# Patient Record
Sex: Female | Born: 1980 | Race: Black or African American | Hispanic: No | Marital: Single | Smoking: Never smoker
Health system: Southern US, Community
[De-identification: ages and names within clinical notes are randomized; demographics above are authoritative.]

## PROBLEM LIST (undated history)

## (undated) DIAGNOSIS — R87619 Unspecified abnormal cytological findings in specimens from cervix uteri: Secondary | ICD-10-CM

## (undated) DIAGNOSIS — G43909 Migraine, unspecified, not intractable, without status migrainosus: Secondary | ICD-10-CM

## (undated) DIAGNOSIS — G44209 Tension-type headache, unspecified, not intractable: Secondary | ICD-10-CM

## (undated) HISTORY — PX: TUBAL LIGATION: SHX77

## (undated) HISTORY — PX: CRYOTHERAPY: SHX1416

## (undated) HISTORY — DX: Tension-type headache, unspecified, not intractable: G44.209

## (undated) HISTORY — DX: Unspecified abnormal cytological findings in specimens from cervix uteri: R87.619

## (undated) HISTORY — DX: Migraine, unspecified, not intractable, without status migrainosus: G43.909

---

## 2015-12-31 ENCOUNTER — Encounter: Payer: Self-pay | Admitting: Osteopathic Medicine

## 2015-12-31 ENCOUNTER — Ambulatory Visit (INDEPENDENT_AMBULATORY_CARE_PROVIDER_SITE_OTHER): Payer: BLUE CROSS/BLUE SHIELD | Admitting: Osteopathic Medicine

## 2015-12-31 VITALS — BP 130/63 | HR 84 | Temp 98.0°F | Ht 61.0 in | Wt 157.0 lb

## 2015-12-31 DIAGNOSIS — M9902 Segmental and somatic dysfunction of thoracic region: Secondary | ICD-10-CM | POA: Diagnosis not present

## 2015-12-31 DIAGNOSIS — N920 Excessive and frequent menstruation with regular cycle: Secondary | ICD-10-CM | POA: Diagnosis not present

## 2015-12-31 DIAGNOSIS — M9901 Segmental and somatic dysfunction of cervical region: Secondary | ICD-10-CM | POA: Diagnosis not present

## 2015-12-31 DIAGNOSIS — G44209 Tension-type headache, unspecified, not intractable: Secondary | ICD-10-CM | POA: Insufficient documentation

## 2015-12-31 LAB — POCT URINE PREGNANCY: PREG TEST UR: NEGATIVE

## 2015-12-31 MED ORDER — BUTALBITAL-APAP-CAFFEINE 50-325-40 MG PO TABS: 1.0000 | ORAL_TABLET | Freq: Four times a day (QID) | ORAL | Status: DC | PRN

## 2015-12-31 MED ORDER — ACETAMINOPHEN-CAFFEINE 500-65 MG PO CAPS: 1.0000 | ORAL_CAPSULE | Freq: Four times a day (QID) | ORAL | Status: DC | PRN

## 2015-12-31 MED ORDER — MEDROXYPROGESTERONE ACETATE 150 MG/ML IM SUSP
150.0000 mg | Freq: Once | INTRAMUSCULAR | Status: AC
  Administered 2015-12-31: 150 mg via INTRAMUSCULAR

## 2015-12-31 MED ORDER — MEDROXYPROGESTERONE ACETATE 150 MG/ML IM SUSP: 150.0000 mg | INTRAMUSCULAR | Status: DC

## 2015-12-31 NOTE — Patient Instructions (Addendum)
Tension Headache A tension headache is a feeling of pain, pressure, or aching that is often felt over the front and sides of the head. The pain can be dull, or it can feel tight (constricting). Tension headaches are not normally associated with nausea or vomiting, and they do not get worse with physical activity. Tension headaches can last from 30 minutes to several days. This is the most common type of headache. CAUSES The exact cause of this condition is not known. Tension headaches often begin after stress, anxiety, or depression. Other triggers may include:  Alcohol.  Too much caffeine, or caffeine withdrawal.  Respiratory infections, such as colds, flu, or sinus infections.  Dental problems or teeth clenching.  Fatigue.  Holding your head and neck in the same position for a long period of time, such as while using a computer.  Smoking. SYMPTOMS Symptoms of this condition include:  A feeling of pressure around the head.  Dull, aching head pain.  Pain felt over the front and sides of the head.  Tenderness in the muscles of the head, neck, and shoulders. DIAGNOSIS This condition may be diagnosed based on your symptoms and a physical exam. Tests may be done, such as a CT scan or an MRI of your head. These tests may be done if your symptoms are severe or unusual. TREATMENT This condition may be treated with lifestyle changes and medicines to help relieve symptoms. HOME CARE INSTRUCTIONS Managing Pain  Take over-the-counter and prescription medicines only as told by your health care provider.  Lie down in a dark, quiet room when you have a headache.  If directed, apply ice to the head and neck area:  Put ice in a plastic bag.  Place a towel between your skin and the bag.  Leave the ice on for 20 minutes, 2-3 times per day.  Use a heating pad or a hot shower to apply heat to the head and neck area as told by your health care provider. Eating and Drinking  Eat meals on  a regular schedule.  Limit alcohol use.  Decrease your caffeine intake, or stop using caffeine. General Instructions  Keep all follow-up visits as told by your health care provider. This is important.  Keep a headache journal to help find out what may trigger your headaches. For example, write down:  What you eat and drink.  How much sleep you get.  Any change to your diet or medicines.  Try massage or other relaxation techniques.  Limit stress.  Sit up straight, and avoid tensing your muscles.  Do not use tobacco products, including cigarettes, chewing tobacco, or e-cigarettes. If you need help quitting, ask your health care provider.  Exercise regularly as told by your health care provider.  Get 7-9 hours of sleep, or the amount recommended by your health care provider. SEEK MEDICAL CARE IF:  Your symptoms are not helped by medicine.  You have a headache that is different from what you normally experience.  You have nausea or you vomit.  You have a fever. SEEK IMMEDIATE MEDICAL CARE IF:  Your headache becomes severe.  You have repeated vomiting.  You have a stiff neck.  You have a loss of vision.  You have problems with speech.  You have pain in your eye or ear.  You have muscular weakness or loss of muscle control.  You lose your balance or you have trouble walking.  You feel faint or you pass out.  You have confusion.     This information is not intended to replace advice given to you by your health care provider. Make sure you discuss any questions you have with your health care provider.   Document Released: 12/12/2005 Document Revised: 09/02/2015 Document Reviewed: 04/06/2015 Elsevier Interactive Patient Education 2016 [employer]  

## 2015-12-31 NOTE — Progress Notes (Signed)
HPI: Judy Flores is a 35 y.o. female who presents to [facility] today for chief complaint of:  Chief Complaint  Patient presents with  . Establish Care  . Headache   HEADACHE . Location: temporal, occasionally tip of head (high cranium) . Quality: throbbing . Severity: moderate . Duration: every other day  . Timing: comes and goes . Context: hasn't been on Depo for about 5 - 6 months, (+) headaches assoc with periods . Modifying factors: has tried OTC Ibuprofen, OTC Tension headache with caffeine meds   . Assoc signs/symptoms: no - nausea/ phonophobia/ vision changes/ weakness/ speech changes. Some photophobia,  Consideration for imaging: NONE - NEGATIVE HISTORY OF FOLLOWING -  ?Focal neurologic signs or symptoms ?Onset of headache with exertion, cough, or sexual activity ?Onset of headache after age 58 years ?Recent significant change in the pattern, frequency, or severity of headaches ?Progressive worsening of headache despite appropriate therapy    Past medical, social and family history reviewed: No past medical history on file. No past surgical history on file. Social History  Substance Use Topics  . Smoking status: Not on file  . Smokeless tobacco: Not on file  . Alcohol Use: Not on file   No family history on file.  No current outpatient prescriptions on file.   No current facility-administered medications for this visit.   No Known Allergies    Review of Systems:  CONSTITUTIONAL:  No  fever, no chills, No  unintentional weight changes HEAD/EYES/EARS/NOSE/THROAT: Yes  headache, no vision change, no hearing change, No  sore throat, No  sinus pressure CARDIAC: No  chest pain, No  pressure, No palpitations, No  orthopnea RESPIRATORY: No  cough, No  shortness of breath/wheeze GASTROINTESTINAL: No  nausea, No  vomiting, No  abdominal pain, No  blood in stool, No  diarrhea, No  constipation  MUSCULOSKELETAL: No   myalgia/arthralgia GENITOURINARY: No  incontinence, No  abnormal genital bleeding/discharge SKIN: No  rash/wounds/concerning lesions HEM/ONC: No  easy bruising/bleeding, No  abnormal lymph node ENDOCRINE: No  polyuria/polydipsia/polyphagia, No  heat/cold intolerance  NEUROLOGIC: No  weakness, No  dizziness, No  slurred speech PSYCHIATRIC: No  concerns with depression, No  concerns with anxiety, No sleep problems     Exam:  BP 130/63 mmHg  Pulse 84  Temp(Src) 98 F (36.7 C) (Oral)  Ht 5\' 1"  (1.549 m)  Wt 157 lb (71.215 kg)  BMI 29.68 kg/m2  SpO2 100% Constitutional: VS see above. General Appearance: alert, well-developed, well-nourished, NAD Eyes: Normal lids and conjunctive, non-icteric sclera, PERRLA Ears, Nose, Mouth, Throat: MMM, Normal external inspection ears/nares/mouth/lips/gums, TM normal, posterior pharynx No  erythema No  exudate Neck: No masses, trachea midline. No thyroid enlargement/tenderness/mass appreciated. No lymphadenopathy Respiratory: Normal respiratory effort. no wheeze, no rhonchi, no rales Cardiovascular: S1/S2 normal, no murmur, no rub/gallop auscultated. RRR.  Musculoskeletal: Gait normal. No clubbing/cyanosis of digits. (+) muscle tension/asymmetry on L shoulder  Neurological: No cranial nerve deficit on limited exam. Motor and sensation intact and symmetric Skin: warm, dry, intact. No rash/ulcer. No concerning nevi or subq nodules on limited exam.   Psychiatric: Normal judgment/insight. Normal mood and affect. Oriented x3.    Procedure OMT: C-spine and uper T-spine, MF and ME applied to (+) pt relief  ASSESSMENT/PLAN:  Tension headache - Plan: butalbital-acetaminophen-caffeine (FIORICET) 50-325-40 MG tablet, DISCONTINUED: Acetaminophen-Caffeine 500-65 MG CAPS  Somatic dysfunction of cervical region  Somatic dysfunction of thoracic region  Menorrhagia with regular cycle - Plan: medroxyPROGESTERone (DEPO-PROVERA) 150  MG/ML injection, POCT urine  pregnancy, medroxyPROGESTERone (DEPO-PROVERA) injection 150 mg     Return in about 2 weeks (around 01/14/2016), or sooner if needed, for HEADACHE RECHECK WITH ANNUAL PHYSICAL.  /

## 2016-01-14 ENCOUNTER — Encounter: Payer: BLUE CROSS/BLUE SHIELD | Admitting: Osteopathic Medicine

## 2016-01-15 ENCOUNTER — Ambulatory Visit (INDEPENDENT_AMBULATORY_CARE_PROVIDER_SITE_OTHER): Payer: BLUE CROSS/BLUE SHIELD | Admitting: Osteopathic Medicine

## 2016-01-15 DIAGNOSIS — Z0000 Encounter for general adult medical examination without abnormal findings: Secondary | ICD-10-CM

## 2016-01-15 NOTE — Progress Notes (Signed)
NO SHOW

## 2016-01-25 ENCOUNTER — Other Ambulatory Visit (HOSPITAL_COMMUNITY)
Admission: RE | Admit: 2016-01-25 | Discharge: 2016-01-25 | Disposition: A | Discharge status: Home / Self Care | Payer: BLUE CROSS/BLUE SHIELD | Source: Ambulatory Visit | Attending: Osteopathic Medicine | Admitting: Osteopathic Medicine

## 2016-01-25 ENCOUNTER — Encounter: Payer: Self-pay | Admitting: Osteopathic Medicine

## 2016-01-25 ENCOUNTER — Ambulatory Visit (INDEPENDENT_AMBULATORY_CARE_PROVIDER_SITE_OTHER): Payer: BLUE CROSS/BLUE SHIELD | Admitting: Osteopathic Medicine

## 2016-01-25 VITALS — BP 120/68 | HR 72 | Ht 61.0 in | Wt 159.0 lb

## 2016-01-25 DIAGNOSIS — Z1151 Encounter for screening for human papillomavirus (HPV): Secondary | ICD-10-CM | POA: Insufficient documentation

## 2016-01-25 DIAGNOSIS — Z113 Encounter for screening for infections with a predominantly sexual mode of transmission: Secondary | ICD-10-CM | POA: Diagnosis present

## 2016-01-25 DIAGNOSIS — Z0000 Encounter for general adult medical examination without abnormal findings: Secondary | ICD-10-CM | POA: Diagnosis not present

## 2016-01-25 DIAGNOSIS — Z13220 Encounter for screening for lipoid disorders: Secondary | ICD-10-CM

## 2016-01-25 DIAGNOSIS — Z124 Encounter for screening for malignant neoplasm of cervix: Secondary | ICD-10-CM

## 2016-01-25 DIAGNOSIS — G44209 Tension-type headache, unspecified, not intractable: Secondary | ICD-10-CM | POA: Diagnosis not present

## 2016-01-25 DIAGNOSIS — Z01419 Encounter for gynecological examination (general) (routine) without abnormal findings: Secondary | ICD-10-CM | POA: Diagnosis present

## 2016-01-25 DIAGNOSIS — Z5181 Encounter for therapeutic drug level monitoring: Secondary | ICD-10-CM | POA: Diagnosis not present

## 2016-01-25 DIAGNOSIS — N760 Acute vaginitis: Secondary | ICD-10-CM | POA: Diagnosis present

## 2016-01-25 MED ORDER — BUTALBITAL-APAP-CAFFEINE 50-325-40 MG PO TABS: 1.0000 | ORAL_TABLET | Freq: Four times a day (QID) | ORAL | Status: AC | PRN

## 2016-01-25 MED ORDER — AMITRIPTYLINE HCL 25 MG PO TABS: 25.0000 mg | ORAL_TABLET | Freq: Every day | ORAL | Status: DC
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
## 2016-01-25 NOTE — Patient Instructions (Addendum)
Tension Headache A tension headache is a feeling of pain, pressure, or aching that is often felt over the front and sides of the head. The pain can be dull, or it can feel tight (constricting). Tension headaches are not normally associated with nausea or vomiting, and they do not get worse with physical activity. Tension headaches can last from 30 minutes to several days. This is the most common type of headache. CAUSES The exact cause of this condition is not known. Tension headaches often begin after stress, anxiety, or depression. Other triggers may include:  Alcohol.  Too much caffeine, or caffeine withdrawal.  Respiratory infections, such as colds, flu, or sinus infections.  Dental problems or teeth clenching.  Fatigue.  Holding your head and neck in the same position for a long period of time, such as while using a computer.  Smoking. SYMPTOMS Symptoms of this condition include:  A feeling of pressure around the head.  Dull, aching head pain.  Pain felt over the front and sides of the head.  Tenderness in the muscles of the head, neck, and shoulders. DIAGNOSIS This condition may be diagnosed based on your symptoms and a physical exam. Tests may be done, such as a CT scan or an MRI of your head. These tests may be done if your symptoms are severe or unusual. TREATMENT This condition may be treated with lifestyle changes and medicines to help relieve symptoms. HOME CARE INSTRUCTIONS Managing Pain  Take over-the-counter and prescription medicines only as told by your health care provider.  Lie down in a dark, quiet room when you have a headache.  If directed, apply ice to the head and neck area:  Put ice in a plastic bag.  Place a towel between your skin and the bag.  Leave the ice on for 20 minutes, 2-3 times per day.  Use a heating pad or a hot shower to apply heat to the head and neck area as told by your health care provider. Eating and Drinking  Eat meals on  a regular schedule.  Limit alcohol use.  Decrease your caffeine intake, or stop using caffeine. General Instructions  Keep all follow-up visits as told by your health care provider. This is important.  Keep a headache journal to help find out what may trigger your headaches. For example, write down:  What you eat and drink.  How much sleep you get.  Any change to your diet or medicines.  Try massage or other relaxation techniques.  Limit stress.  Sit up straight, and avoid tensing your muscles.  Do not use tobacco products, including cigarettes, chewing tobacco, or e-cigarettes. If you need help quitting, ask your health care provider.  Exercise regularly as told by your health care provider.  Get 7-9 hours of sleep, or the amount recommended by your health care provider. SEEK MEDICAL CARE IF:  Your symptoms are not helped by medicine.  You have a headache that is different from what you normally experience.  You have nausea or you vomit.  You have a fever. SEEK IMMEDIATE MEDICAL CARE IF:  Your headache becomes severe.  You have repeated vomiting.  You have a stiff neck.  You have a loss of vision.  You have problems with speech.  You have pain in your eye or ear.  You have muscular weakness or loss of muscle control.  You lose your balance or you have trouble walking.  You feel faint or you pass out.  You have confusion.     This information is not intended to replace advice given to you by your health care provider. Make sure you discuss any questions you have with your health care provider.   Document Released: 12/12/2005 Document Revised: 09/02/2015 Document Reviewed: 04/06/2015 Elsevier Interactive Patient Education 2016 [employer]  

## 2016-01-27 LAB — CYTOLOGY - PAP

## 2016-01-28 LAB — CBC WITH DIFFERENTIAL/PLATELET
BASOS ABS: 0.1 10*3/uL (ref 0.0–0.1)
BASOS PCT: 1 % (ref 0–1)
EOS ABS: 0.1 10*3/uL (ref 0.0–0.7)
Eosinophils Relative: 1 % (ref 0–5)
HCT: 35.2 % — ABNORMAL LOW (ref 36.0–46.0)
HEMOGLOBIN: 11 g/dL — AB (ref 12.0–15.0)
Lymphocytes Relative: 28 % (ref 12–46)
Lymphs Abs: 1.8 10*3/uL (ref 0.7–4.0)
MCH: 26.8 pg (ref 26.0–34.0)
MCHC: 31.3 g/dL (ref 30.0–36.0)
MCV: 85.9 fL (ref 78.0–100.0)
MPV: 9.1 fL (ref 8.6–12.4)
Monocytes Absolute: 0.3 10*3/uL (ref 0.1–1.0)
Monocytes Relative: 5 % (ref 3–12)
NEUTROS ABS: 4.2 10*3/uL (ref 1.7–7.7)
NEUTROS PCT: 65 % (ref 43–77)
PLATELETS: 378 10*3/uL (ref 150–400)
RBC: 4.1 MIL/uL (ref 3.87–5.11)
RDW: 13.7 % (ref 11.5–15.5)
WBC: 6.5 10*3/uL (ref 4.0–10.5)

## 2016-01-29 LAB — LIPID PANEL
CHOLESTEROL: 159 mg/dL (ref 125–200)
HDL: 56 mg/dL (ref >=46)
LDL CALC: 95 mg/dL (ref <130)
Total CHOL/HDL Ratio: 2.8 Ratio (ref <=5.0)
Triglycerides: 41 mg/dL (ref <150)
VLDL: 8 mg/dL (ref <30)

## 2016-01-29 LAB — COMPLETE METABOLIC PANEL WITH GFR
ALT: 10 U/L (ref 6–29)
AST: 14 U/L (ref 10–30)
Albumin: 4.4 g/dL (ref 3.6–5.1)
Alkaline Phosphatase: 54 U/L (ref 33–115)
BILIRUBIN TOTAL: 0.4 mg/dL (ref 0.2–1.2)
BUN: 10 mg/dL (ref 7–25)
CALCIUM: 9 mg/dL (ref 8.6–10.2)
CHLORIDE: 105 mmol/L (ref 98–110)
CO2: 27 mmol/L (ref 20–31)
CREATININE: 0.57 mg/dL (ref 0.50–1.10)
GFR, Est Non African American: >89 mL/min (ref >=60)
Glucose, Bld: 81 mg/dL (ref 65–99)
Potassium: 4.1 mmol/L (ref 3.5–5.3)
Sodium: 140 mmol/L (ref 135–146)
TOTAL PROTEIN: 7.3 g/dL (ref 6.1–8.1)

## 2016-07-06 ENCOUNTER — Ambulatory Visit (INDEPENDENT_AMBULATORY_CARE_PROVIDER_SITE_OTHER): Payer: BLUE CROSS/BLUE SHIELD | Admitting: Osteopathic Medicine

## 2016-07-06 ENCOUNTER — Encounter: Payer: Self-pay | Admitting: Osteopathic Medicine

## 2016-07-06 VITALS — BP 110/76 | HR 85 | Ht 61.0 in | Wt 169.0 lb

## 2016-07-06 DIAGNOSIS — N760 Acute vaginitis: Secondary | ICD-10-CM | POA: Diagnosis not present

## 2016-07-06 DIAGNOSIS — G44209 Tension-type headache, unspecified, not intractable: Secondary | ICD-10-CM

## 2016-07-06 DIAGNOSIS — Z3042 Encounter for surveillance of injectable contraceptive: Secondary | ICD-10-CM

## 2016-07-06 DIAGNOSIS — A499 Bacterial infection, unspecified: Secondary | ICD-10-CM

## 2016-07-06 DIAGNOSIS — B9689 Other specified bacterial agents as the cause of diseases classified elsewhere: Secondary | ICD-10-CM

## 2016-07-06 DIAGNOSIS — R21 Rash and other nonspecific skin eruption: Secondary | ICD-10-CM | POA: Diagnosis not present

## 2016-07-06 LAB — POCT URINE PREGNANCY: PREG TEST UR: NEGATIVE

## 2016-07-06 MED ORDER — MEDROXYPROGESTERONE ACETATE 150 MG/ML IM SUSP
150.0000 mg | Freq: Once | INTRAMUSCULAR | Status: AC
  Administered 2016-07-06: 150 mg via INTRAMUSCULAR

## 2016-07-06 MED ORDER — METRONIDAZOLE 500 MG PO TABS: 500.0000 mg | ORAL_TABLET | Freq: Two times a day (BID) | ORAL | Status: DC

## 2016-07-06 NOTE — Progress Notes (Signed)
HPI: Judy Flores is a 35 y.o. Not Hispanic or Latino female  who presents to [facility] today, 07/06/2016,  for chief complaint of:  Chief Complaint  Patient presents with  . Vaginal Discharge  . Contraception    DEPO INJECTION     . VAGINAL DISCHARGE: went swimming (pool) and seems to get bacterial vaginosis from this. Not too severe but bothersome. Has had BV in the past - feels similar. No new sexual contacts. No fever/chills, no joint pain.  Marland Kitchen RASH -  Bilateral ankles, itches, patchy, no meds tried, no insect bites she knows of. Also appeared right after was in the pool for awhile.   CONTRACEPTION - last records form here, Depo 12/2015 >3 mos ago  Hx HEADACHES - doing well on meds as below   Past medical, surgical, social and family history reviewed: Past Medical History  Diagnosis Date  . Tension headache    Past Surgical History  Procedure Laterality Date  . Tubal ligation     Social History  Substance Use Topics  . Smoking status: Never Smoker   . Smokeless tobacco: Not on file  . Alcohol Use: No   Family History  Problem Relation Age of Onset  . Hypertension Maternal Grandmother      Current medication list and allergy/intolerance information reviewed:   Current Outpatient Prescriptions  Medication Sig Dispense Refill  . amitriptyline (ELAVIL) 25 MG tablet Take 1 tablet (25 mg total) by mouth at bedtime. Can increase to 2 tablets (50 mg total) by mouth at bedtime after 1 week. 60 tablet 2  . medroxyPROGESTERone (DEPO-PROVERA) 150 MG/ML injection Inject 1 mL (150 mg total) into the muscle every 3 (three) months. 1 mL 3  . Multiple Vitamins-Minerals (WOMENS MULTI PO) Take by mouth.     No current facility-administered medications for this visit.   No Known Allergies    Review of Systems:  Constitutional:  No  fever, no chills, No recent illness,  HEENT: No  headache, no vision change  Cardiac: No  chest  pain  Musculoskeletal: No new myalgia/arthralgia  Genitourinary: No  abnormal genital bleeding, (+) abnormal genital discharge  Skin: (+) Rash, No other wounds/concerning lesions   Exam:  BP 110/76 mmHg  Pulse 85  Ht  (1.549 m)  Wt 169 lb (76.658 kg)  BMI 31.95 kg/m2  Constitutional: VS see above. General Appearance: alert, well-developed, well-nourished, NAD  Skin: Mild erythema, scaly patches on ankles bilaterally c/w possible tinea, less likely allergic urticaria, Skin otherwise warm, dry, intact. No rash/ulcer. No concerning nevi or subq nodules on limited exam.    Psychiatric: Normal judgment/insight. Normal mood and affect. Oriented x3.     ASSESSMENT/PLAN:   Confirm neg pregnancy, Depo given, f/u q3 mos consistently  Rash likely tinea - OTC clotrimazole adviesd, RTC if no better  Discharge likely BV - Flagyl Rx, side effects reviewed, swab deferred at this time, no need for STD testing but will get wet prep and GC/CT screen if discharge persists.   Bacterial vaginosis - Plan: metroNIDAZOLE (FLAGYL) 500 MG tablet  Rash and nonspecific skin eruption  On Depo-Provera for contraception  Tension headache     Visit summary with medication list and pertinent instructions was printed for patient to review. All questions at time of visit were answered - patient instructed to contact office with any additional concerns. ER/RTC precautions were reviewed with the patient. Follow-up plan: Return in about 3 months (around 10/06/2016), or sooner if  needed or if symptoms worsen or fail to get better, for NURSE VISIT - DEPO INJECTION. Otherwise, due for annual wellness exam 12/2016.

## 2016-07-06 NOTE — Addendum Note (Signed)
Addended by: Pixie CasinoUNNINGHAM, Khalin Royce C on: 07/06/2016 10:29 AM   Modules accepted: Orders

## 2016-07-06 NOTE — Patient Instructions (Addendum)
Clotrimazole cream for rash - can get this OTC. Continue for one week after rash resolves. If not better, let me know.

## 2016-08-08 ENCOUNTER — Encounter: Payer: Self-pay | Admitting: Osteopathic Medicine

## 2016-08-08 ENCOUNTER — Ambulatory Visit (INDEPENDENT_AMBULATORY_CARE_PROVIDER_SITE_OTHER): Payer: BLUE CROSS/BLUE SHIELD | Admitting: Osteopathic Medicine

## 2016-08-08 VITALS — BP 115/79 | HR 81 | Ht 61.0 in | Wt 171.0 lb

## 2016-08-08 DIAGNOSIS — N970 Female infertility associated with anovulation: Secondary | ICD-10-CM | POA: Diagnosis not present

## 2016-08-08 DIAGNOSIS — Z3042 Encounter for surveillance of injectable contraceptive: Secondary | ICD-10-CM | POA: Diagnosis not present

## 2016-08-08 LAB — CBC WITH DIFFERENTIAL/PLATELET
BASOS ABS: 0 {cells}/uL (ref 0–200)
Basophils Relative: 0 %
EOS ABS: 144 {cells}/uL (ref 15–500)
Eosinophils Relative: 2 %
HEMATOCRIT: 35.6 % (ref 35.0–45.0)
Hemoglobin: 11.2 g/dL — ABNORMAL LOW (ref 11.7–15.5)
LYMPHS ABS: 1584 {cells}/uL (ref 850–3900)
MCH: 26.9 pg — AB (ref 27.0–33.0)
MCHC: 31.5 g/dL — ABNORMAL LOW (ref 32.0–36.0)
MCV: 85.4 fL (ref 80.0–100.0)
MONO ABS: 360 {cells}/uL (ref 200–950)
MPV: 9.4 fL (ref 7.5–12.5)
Monocytes Relative: 5 %
NEUTROS PCT: 71 %
Neutro Abs: 5112 cells/uL (ref 1500–7800)
Platelets: 367 10*3/uL (ref 140–400)
RBC: 4.17 MIL/uL (ref 3.80–5.10)
RDW: 14.1 % (ref 11.0–15.0)
WBC: 7.2 10*3/uL (ref 3.8–10.8)

## 2016-08-08 MED ORDER — NORETHIN ACE-ETH ESTRAD-FE 1.5-30 MG-MCG PO TABS: 1.0000 | ORAL_TABLET | Freq: Every day | ORAL | 0 refills | Status: DC

## 2016-08-08 MED ORDER — IBUPROFEN 800 MG PO TABS
800.0000 mg | ORAL_TABLET | Freq: Three times a day (TID) | ORAL | 0 refills | Status: AC
Start: 2016-08-08 — End: ?

## 2016-08-08 NOTE — Patient Instructions (Signed)
Unscheduled bleeding with Depo-Provera is a common side effect with long-term use of this medicine. My suspicion is that the bleeding you are experiencing, even though it has gotten a bit heavy, is due to long-term effects of Depo-Provera, rather then to any other concerning cause.   The first step in management of bleeding on Depo is 5-7 days of nonsteroidal anti-inflammatory medication (Ibuprofen 800 mg three times per day), plus 1 month of birth control pills with estrogen component to them. As long as no significant anemia requiring intervention, and no worsening of pain or serious bleeding, bleeding should get better with this medication regimen.   If your symptoms are worsening or failing to improve - for instance, if you are still having spotting after about a week or 2 on the birth control medications, or if your pain worsens or your continue to have significant clot passage -  we will need to consider an ultrasound at that point as well as possible referral to OB/GYN since you may need a surgical procedure such as D&C to help stop the bleeding.   It is possible that you will have future events of irregular bleeding on Depo, I could not reliably predict if or when this could happen, if at all. If you would like to consider alternative contraception methods, let us know.    Plan: 1. Lab downstairs to check blood counts for anemia 2. Go to pharmacy for   prescription dose NSAID    estrogen containing birth control pills 3. Take medications as directed, bleeding should improve 4. If bleeding is not better in the next 1-2 weeks, or if it gets worse or he have worsening pain, let us know!

## 2016-08-08 NOTE — Progress Notes (Signed)
HPI: Judy Flores is a [age] y.o. female  who presents to [facility] [place] today, 08/08/16,  for chief complaint of:  Chief Complaint  Patient presents with  . Vaginal Bleeding    . Context: Increased bleeding over 3 weeks, patient on Depo-Provera. Was seen in emergency room early this morning. Pregnancy test negative. No concerns on urinalysis - small blood on catheterized specimen. No CBC performed.   . Quality: heavier past few days, passing some clots, other times just dark brownish bloody visit.  . Duration: 3 weeks altogether . Modifying factors: on Depo as noted above   Past medical, surgical, social and family history reviewed: Past Medical History:  Diagnosis Date  . Tension headache    Past Surgical History:  Procedure Laterality Date  . TUBAL LIGATION     Social History  Substance Use Topics  . Smoking status: Never Smoker  . Smokeless tobacco: Not on file  . Alcohol use No   Family History  Problem Relation Age of Onset  . Hypertension Maternal Grandmother      Current medication list and allergy/intolerance information reviewed:   Current Outpatient Prescriptions  Medication Sig Dispense Refill  . amitriptyline (ELAVIL) 25 MG tablet Take 1 tablet (25 mg total) by mouth at bedtime. Can increase to 2 tablets (50 mg total) by mouth at bedtime after 1 week. 60 tablet 2  . Multiple Vitamins-Minerals (WOMENS MULTI PO) Take by mouth.     No current facility-administered medications for this visit.    No Known Allergies    Review of Systems:  Constitutional:  No  fever, no chills, No recent illness, No unintentional weight changes. (+) significant fatigue.   HEENT: No  headache, no vision change  Cardiac: No  chest pain, No  pressure, No palpitation  Respiratory:  No  shortness of breath  Gastrointestinal: (+) crampy lower  abdominal pain, No  nausea, No  vomiting,  No  blood in stool, No  diarrhea, No  constipation    Musculoskeletal: No new myalgia/arthralgia  Genitourinary: No  incontinence, (+) abnormal genital bleeding, No abnormal genital discharge  Skin: No  Rash, No other wounds/concerning lesions  Hem/Onc: No  easy bruising/bleeding from gums, no bloody stool  Exam:  BP 115/79   Pulse 81   Ht 5\' 1"  (1.549 m)   Wt 171 lb (77.6 kg)   BMI 32.31 kg/m   Constitutional: VS see above. General Appearance: alert, well-developed, well-nourished, NAD  Eyes: Normal lids and conjunctive, non-icteric sclera  Ears, Nose, Mouth, Throat: MMM, Normal external inspection ears/nares/mouth/lips/gums.  Neck: No masses, trachea midline.   Respiratory: Normal respiratory effort.   Cardiovascular: No lower extremity edema.   Gastrointestinal: Nontender, no masses.  Neurological: Normal balance/coordination. No tremor.   Skin: warm, dry, intact. No rash/ulcer.  Gyn: No lesions/ulcers to external genitalia, normal urethra, normal vaginal mucosa, (+) dark bloody discharge, cervix normal without lesions and no blood oozing from cervical os, uterus not enlarged or tender, adnexa no masses and nontender    ASSESSMENT/PLAN:   Anovulatory (dysfunctional uterine) bleeding - Plan: CBC with Differential, ibuprofen (ADVIL,MOTRIN) 800 MG tablet, norethindrone-ethinyl estradiol-iron (MICROGESTIN FE,GILDESS FE,LOESTRIN FE) 1.5-30 MG-MCG tablet  On Depo-Provera for contraception - Plan: norethindrone-ethinyl estradiol-iron (MICROGESTIN FE,GILDESS FE,LOESTRIN FE) 1.5-30 MG-MCG tablet   Patient Instructions  Unscheduled bleeding with Depo-Provera is a common side effect with long-term use of this medicine. My suspicion is that the bleeding you are experiencing, even though it has gotten a bit heavy,  is due to long-term effects of Depo-Provera, rather then to any other concerning cause.   The first step in management of bleeding on Depo is 5-7 days of nonsteroidal anti-inflammatory medication (Ibuprofen 800 mg three  times per day), plus 1 month of birth control pills with estrogen component to them. As long as no significant anemia requiring intervention, and no worsening of pain or serious bleeding, bleeding should get better with this medication regimen.   If your symptoms are worsening or failing to improve - for instance, if you are still having spotting after about a week or 2 on the birth control medications, or if your pain worsens or your continue to have significant clot passage -  we will need to consider an ultrasound at that point as well as possible referral to OB/GYN since you may need a surgical procedure such as D&C to help stop the bleeding.   It is possible that you will have future events of irregular bleeding on Depo, I could not reliably predict if or when this could happen, if at all. If you would like to consider alternative contraception methods, let us know.    Plan: 1. Lab downstairs to check blood counts for anemia 2. Go to pharmacy for   prescription dose NSAID    estrogen containing birth control pills 3. Take medications as directed, bleeding should improve 4. If bleeding is not better in the next 1-2 weeks, or if it gets worse or he have worsening pain, let us know!    Visit summary with medication list and pertinent instructions was printed for patient to review. All questions at time of visit were answered - patient instructed to contact office with any additional concerns. ER/RTC precautions were reviewed with the patient. Follow-up plan: Return if symptoms worsen or fail to improve.

## 2016-08-11 ENCOUNTER — Inpatient Hospital Stay: Payer: BLUE CROSS/BLUE SHIELD | Admitting: Osteopathic Medicine

## 2016-09-05 ENCOUNTER — Encounter: Payer: Self-pay | Admitting: Osteopathic Medicine

## 2016-09-05 DIAGNOSIS — N939 Abnormal uterine and vaginal bleeding, unspecified: Secondary | ICD-10-CM

## 2016-09-07 ENCOUNTER — Telehealth: Payer: Self-pay

## 2016-09-07 NOTE — Telephone Encounter (Signed)
Myriam JacobsonHelen called  York SpanielSaid that she need a order added for ultrasound pelvic complete it is needed in addition to the US transvaginal that has already been ordered. Please advise patient is scheduled for ultrasound for 09/08/2016. Rhonda Cunningham,CMA

## 2016-09-08 ENCOUNTER — Ambulatory Visit (INDEPENDENT_AMBULATORY_CARE_PROVIDER_SITE_OTHER): Payer: BLUE CROSS/BLUE SHIELD

## 2016-09-08 ENCOUNTER — Other Ambulatory Visit: Payer: Self-pay | Admitting: Osteopathic Medicine

## 2016-09-08 DIAGNOSIS — N939 Abnormal uterine and vaginal bleeding, unspecified: Secondary | ICD-10-CM | POA: Diagnosis not present

## 2016-09-12 ENCOUNTER — Other Ambulatory Visit: Payer: Self-pay | Admitting: Osteopathic Medicine

## 2016-09-12 DIAGNOSIS — N970 Female infertility associated with anovulation: Secondary | ICD-10-CM

## 2016-09-12 DIAGNOSIS — Z3042 Encounter for surveillance of injectable contraceptive: Secondary | ICD-10-CM

## 2016-09-13 ENCOUNTER — Telehealth: Payer: Self-pay

## 2016-09-13 ENCOUNTER — Encounter: Payer: Self-pay | Admitting: Osteopathic Medicine

## 2016-09-13 NOTE — Telephone Encounter (Signed)
Left message for pt to call office. Received Referral from primary care.

## 2016-09-14 ENCOUNTER — Other Ambulatory Visit: Payer: Self-pay | Admitting: Osteopathic Medicine

## 2016-09-14 DIAGNOSIS — N970 Female infertility associated with anovulation: Secondary | ICD-10-CM

## 2016-09-14 DIAGNOSIS — Z3042 Encounter for surveillance of injectable contraceptive: Secondary | ICD-10-CM

## 2016-09-14 MED ORDER — NORETHIN ACE-ETH ESTRAD-FE 1.5-30 MG-MCG PO TABS: 1.0000 | ORAL_TABLET | Freq: Every day | ORAL | 0 refills | Status: DC

## 2016-09-20 ENCOUNTER — Encounter: Payer: BLUE CROSS/BLUE SHIELD | Admitting: Obstetrics & Gynecology

## 2016-10-05 ENCOUNTER — Other Ambulatory Visit: Payer: Self-pay | Admitting: Osteopathic Medicine

## 2016-10-05 DIAGNOSIS — Z3042 Encounter for surveillance of injectable contraceptive: Secondary | ICD-10-CM

## 2016-10-05 DIAGNOSIS — N970 Female infertility associated with anovulation: Secondary | ICD-10-CM

## 2016-10-06 NOTE — Telephone Encounter (Signed)
She was prescribed the birth control pills due to abnormal bleeding, this is not something that should be refilled. I referred her to OB/GYN, I see where they have called the patient on 09/13/2016, can confirm with the patient if she is still having bleeding problems she'll follow-up with OB/GYN

## 2016-10-06 NOTE — Telephone Encounter (Signed)
Pt states she missed appt with OB/GYN but has rescheduled for 10/11/16. Will route.

## 2016-10-06 NOTE — Telephone Encounter (Signed)
Judy Flores continue to take the BCP and not get the Depo Provera? She is one day past due for the Depo Provera injection. Please advise.

## 2016-10-06 NOTE — Telephone Encounter (Signed)
Left message advising a call back.

## 2016-10-11 ENCOUNTER — Ambulatory Visit (INDEPENDENT_AMBULATORY_CARE_PROVIDER_SITE_OTHER): Payer: BLUE CROSS/BLUE SHIELD | Admitting: Obstetrics & Gynecology

## 2016-10-11 ENCOUNTER — Encounter: Payer: Self-pay | Admitting: Obstetrics & Gynecology

## 2016-10-11 VITALS — BP 112/73 | HR 89 | Resp 16 | Ht 62.0 in | Wt 173.0 lb

## 2016-10-11 DIAGNOSIS — N921 Excessive and frequent menstruation with irregular cycle: Secondary | ICD-10-CM

## 2016-10-11 MED ORDER — NORETHIN ACE-ETH ESTRAD-FE 1-20 MG-MCG(24) PO TABS: 1.0000 | ORAL_TABLET | Freq: Every day | ORAL | 11 refills | Status: DC

## 2016-10-11 NOTE — Patient Instructions (Signed)
Caltrate 2 by mouth daily

## 2016-10-11 NOTE — Progress Notes (Signed)
History:  35 y.o. [id number] here today for f/u of AUB.  She has been on Depo Provera for 7 years.  After her last injection she began to have bleeding that would not stop.  Her last injection was July 06, 2016. Pt stopped bleeding while on Loestrin 1.5/30 on 2 different occassions.  Pt had BTL in 02/2009.  She thought she was told tha tshe still needed birth control.  The following portions of the patient's history were reviewed and updated as appropriate: allergies, current medications, past family history, past medical history, past social history, past surgical history and problem list.  Review of Systems:  Pertinent items are noted in HPI.  Objective:  Physical Exam Blood pressure 112/73, pulse 89, resp. rate 16, height 5\' 2"  (1.575 m), weight 173 lb (78.5 kg). Gen: NAD Lungs: CTA CV: RRR Abd: Soft, nontender and nondistended Pelvic: Normal appearing external genitalia; normal appearing vaginal mucosa and cervix.  Normal discharge.  Small uterus, no other palpable masses, no uterine or adnexal tenderness  Labs and Imaging 09/08/2016 CLINICAL DATA:  C/o vaginal bleeding intermittently for the past 3 months. PT states being on Depo-Provera shot x 6653yrs. Hx Tubal ligation.  EXAM: TRANSABDOMINAL AND TRANSVAGINAL ULTRASOUND OF PELVIS  TECHNIQUE: Both transabdominal and transvaginal ultrasound examinations of the pelvis were performed. Transabdominal technique was performed for global imaging of the pelvis including uterus, ovaries, adnexal regions, and pelvic cul-de-sac. It was necessary to proceed with endovaginal exam following the transabdominal exam to visualize the endometrium and ovaries.  COMPARISON:  None  FINDINGS: Uterus  Measurements: 9.2 x 3.8 x 4.7 cm. Somewhat heterogeneous echotexture, but no discrete mass. Cervix is unremarkable.  Endometrium  Thickness: 7.6 mm.  No focal abnormality visualized.  Right ovary  Measurements: 2.8 x 2.1 x 1.8 cm. Normal  appearance/no adnexal mass.  Left ovary  Measurements: 2.5 x 1.1 x 1.6 cm. Normal appearance/no adnexal mass.  Other findings  No abnormal free fluid.  IMPRESSION: 1. Uterus has a somewhat heterogeneous echotexture but is otherwise unremarkable. Endometrium is normal in thickness. No endometrial mass or endometrial canal fluid. 2. Normal ovaries and adnexa   [facility] both fallopian tubes.  Assessment & Plan:  Abnormal bleeding on Depo provera.   Pt will not get another Depo provera at present Loestrin 1/20 1 po q day F/u in 4 months or sooner prn Caltrate 2 po q day  Total face-to-face time with patient was 20 min.  Greater than 50% was spent in counseling and coordination of care with the patient. We discussed pathology seen from prev BTL (chart reviewed on Care Everywhere); probable etiology of AUB; bone protection after prolonged Depo Provera use.  Nalaysia Manganiello L. Harraway-Smith, M.D., [facility]       Shakeerah Gradel L. Harraway-Smith, M.D., [facility]

## 2016-11-28 ENCOUNTER — Encounter: Payer: Self-pay | Admitting: Osteopathic Medicine

## 2016-11-29 ENCOUNTER — Other Ambulatory Visit: Payer: Self-pay | Admitting: Osteopathic Medicine

## 2016-11-29 DIAGNOSIS — N760 Acute vaginitis: Principal | ICD-10-CM

## 2016-11-29 DIAGNOSIS — B9689 Other specified bacterial agents as the cause of diseases classified elsewhere: Secondary | ICD-10-CM

## 2016-11-29 MED ORDER — METRONIDAZOLE 500 MG PO TABS: 500.0000 mg | ORAL_TABLET | Freq: Two times a day (BID) | ORAL | 0 refills | Status: AC

## 2017-03-06 ENCOUNTER — Encounter: Payer: Self-pay | Admitting: Osteopathic Medicine

## 2017-03-06 MED ORDER — METRONIDAZOLE 500 MG PO TABS: 500.0000 mg | ORAL_TABLET | Freq: Two times a day (BID) | ORAL | 0 refills | Status: AC

## 2017-03-09 ENCOUNTER — Ambulatory Visit (INDEPENDENT_AMBULATORY_CARE_PROVIDER_SITE_OTHER): Payer: Self-pay | Admitting: Osteopathic Medicine

## 2017-03-09 ENCOUNTER — Encounter: Payer: Self-pay | Admitting: Osteopathic Medicine

## 2017-03-09 VITALS — BP 121/69 | HR 72 | Ht 62.0 in | Wt 172.0 lb

## 2017-03-09 DIAGNOSIS — B9689 Other specified bacterial agents as the cause of diseases classified elsewhere: Secondary | ICD-10-CM

## 2017-03-09 DIAGNOSIS — N760 Acute vaginitis: Secondary | ICD-10-CM

## 2017-03-09 DIAGNOSIS — Z113 Encounter for screening for infections with a predominantly sexual mode of transmission: Secondary | ICD-10-CM

## 2017-03-09 DIAGNOSIS — N898 Other specified noninflammatory disorders of vagina: Secondary | ICD-10-CM

## 2017-03-09 DIAGNOSIS — Z30013 Encounter for initial prescription of injectable contraceptive: Secondary | ICD-10-CM | POA: Insufficient documentation

## 2017-03-09 DIAGNOSIS — N939 Abnormal uterine and vaginal bleeding, unspecified: Secondary | ICD-10-CM

## 2017-03-09 LAB — WET PREP, GENITAL
Trich, Wet Prep: NONE SEEN
Yeast Wet Prep HPF POC: NONE SEEN

## 2017-03-09 LAB — POCT URINE PREGNANCY: Preg Test, Ur: NEGATIVE

## 2017-03-09 MED ORDER — CALCIUM CARBONATE-VITAMIN D 600-400 MG-UNIT PO CHEW: 2.0000 | CHEWABLE_TABLET | Freq: Every day | ORAL | 3 refills | Status: AC

## 2017-03-09 MED ORDER — MEDROXYPROGESTERONE ACETATE 150 MG/ML IM SUSP
150.0000 mg | Freq: Once | INTRAMUSCULAR | Status: AC
  Administered 2017-03-09: 150 mg via INTRAMUSCULAR

## 2017-03-09 MED ORDER — METRONIDAZOLE 0.75 % VA GEL: 1.0000 | Freq: Every day | VAGINAL | 1 refills | Status: DC

## 2017-03-09 NOTE — Progress Notes (Signed)
HPI: Judy Flores is a 36 y.o. female  who presents to [facility] [place] today, 03/09/17,  for chief complaint of:  Chief Complaint  Patient presents with  . Contraception  . SEXUALLY TRANSMITTED DISEASE    SCREENING     See chart for review of patient email encounters. Since last visit, has undergone 2 treatments with metronidazole for presumed bacterial vaginosis and was encouraged to follow-up if symptoms persisted.   Dancing intermittent thin vaginal discharge. Sounds consistent with bacterial vaginosis but patient is concerned as keeps recurring. She is not particularly worried about sexual transmitted infections but would like to go ahead and get checked for gonorrhea, chlamydia, Trichomonas.   Status post BTL 02/2009.  Recent records reviewed from OB/GYN visit 10/11/2016. Abnormal bleeding on Depo-Provera, was discontinued at that point. Started Loestrin 1/20 daily, plus Caltrate supplementation after prolonged Depo-Provera use.  Patient is experiencing intermittent bleeding and spotting, would like to get back on the Depo-Provera to help stop periods   Past medical history, surgical history, social history and family history reviewed.  Patient Active Problem List   Diagnosis Date Noted  . Encounter for prescription for depo-Provera 03/09/2017  . Tension headache 12/31/2015    Current medication list and allergy/intolerance information reviewed.   Current Outpatient Prescriptions on File Prior to Visit  Medication Sig Dispense Refill  . amitriptyline (ELAVIL) 25 MG tablet Take 1 tablet (25 mg total) by mouth at bedtime. Can increase to 2 tablets (50 mg total) by mouth at bedtime after 1 week. 60 tablet 2  . diclofenac (VOLTAREN) 75 MG EC tablet Take by mouth.    Marland Kitchen. ibuprofen (ADVIL,MOTRIN) 800 MG tablet Take 1 tablet (800 mg total) by mouth 3 (three) times daily. For 5 - 7 days (can discontinue use if bleeding stops) 30 tablet 0  .  medroxyPROGESTERone (DEPO-PROVERA) 150 MG/ML injection Inject into the muscle.    . metroNIDAZOLE (FLAGYL) 500 MG tablet Take 1 tablet (500 mg total) by mouth 2 (two) times daily. For bacterial vaginosis 14 tablet 0  . Multiple Vitamins-Minerals (THERA-M) TABS Take by mouth.    . Multiple Vitamins-Minerals (WOMENS MULTI PO) Take by mouth.    . Norethindrone Acetate-Ethinyl Estrad-FE (LOESTRIN 24 FE) 1-20 MG-MCG(24) tablet Take 1 tablet by mouth daily. 1 Package 11   No current facility-administered medications on file prior to visit.    No Known Allergies    Review of Systems:  Constitutional: No recent illness  Cardiac: No  chest pain,  Respiratory:  No  shortness of breath  GU: Vaginal discharge and bleeding as noted in history of present illness  Neurologic: No  weakness, No  Dizziness   Exam:  BP 121/69   Pulse 72   Ht 5\' 2"  (1.575 m)   Wt 172 lb (78 kg)   BMI 31.46 kg/m   Constitutional: VS see above. General Appearance: alert, well-developed, well-nourished, NAD  Eyes: Normal lids and conjunctive, non-icteric sclera  Ears, Nose, Mouth, Throat: MMM, Normal external inspection ears/nares/mouth/lips/gums.  Neck: No masses, trachea midline.   Respiratory: Normal respiratory effort. no wheeze, no rhonchi, no rales  Cardiovascular: S1/S2 normal, no murmur, no rub/gallop auscultated. RRR.   Musculoskeletal: Gait normal. Symmetric and independent movement of all extremities  Neurological: Normal balance/coordination. No tremor.  Skin: warm, dry, intact.   Psychiatric: Normal judgment/insight. Normal mood and affect. Oriented x3.  GYN: No lesions/ulcers to external genitalia, normal urethra, normal vaginal mucosa, physiologic discharge - swab collected for wet prep,  cervix normal without lesions, uterus not enlarged or tender, adnexa no masses and nontender    Recent Results (from the past 2160 hour(s))  POCT urine pregnancy     Status: None   Collection Time:  03/09/17 10:54 AM  Result Value Ref Range   Preg Test, Ur Negative Negative  Wet prep, genital     Status: Abnormal   Collection Time: 03/09/17 11:06 AM  Result Value Ref Range   Yeast Wet Prep HPF POC NONE SEEN NONE SEEN   Trich, Wet Prep NONE SEEN NONE SEEN   Clue Cells Wet Prep HPF POC MOD (A) NONE SEEN   WBC, Wet Prep HPF POC FEW NONE SEEN     ASSESSMENT/PLAN:   Bacterial vaginosis - Will trial MetroGel. Condier Boric Acid.   Encounter for prescription for depo-Provera - Plan: POCT urine pregnancy, medroxyPROGESTERone (DEPO-PROVERA) injection 150 mg  Screen for STD (sexually transmitted disease) - Patient declines blood test at this time for HIV, RPR, hepatitis B. Advised to reconsider, we'll definitely want to test if any other STI is positive - Plan: GC/Chlamydia Probe Amp, RPR, HIV antibody, Hepatitis B core antibody, total, Hepatitis B surface antibody, Hepatitis B surface antigen, Wet prep, genital, CANCELED: HIV antibody  Vaginal discharge - Plan: CANCELED: Wet prep, genital  Abnormal uterine bleeding - Okay to get back on With calcium/vitamin D supplementation. Encouraged patient to talk with GYN regarding procedures such as ablation    Patient Instructions  Plan:  STI screening today: urine for gonorrhea & chlamydia, blood for HIV, Syphilis, Hepatitis B  Vaginal swab to evaluate for BV vs yeast, will also check for STI trichomonas  Depending on results, will consider another treatment for BV  Restart Depo - if bleeding persists, I would make appointment with GYN to discuss procedures which may be long-term/permanent solutions to persistent bleeding. Long-term Depo can cause problems with calcium deficiency, see prescription for supplementation w/ Calcium + Vitamin D (also available OTC)     Follow-up plan: Return for annual physical when due, sooner if needed.  Visit summary with medication list and pertinent instructions was printed for patient to review, alert  [name] if any changes needed. All questions at time of visit were answered - patient instructed to contact office with any additional concerns. ER/RTC precautions were reviewed with the patient and understanding verbalized.

## 2017-03-09 NOTE — Patient Instructions (Addendum)
Plan:  STI screening today: urine for gonorrhea & chlamydia, blood for HIV, Syphilis, Hepatitis B  Vaginal swab to evaluate for BV vs yeast, will also check for STI trichomonas  Depending on results, will consider another treatment for BV  Restart Depo - if bleeding persists, I would make appointment with GYN to discuss procedures which may be long-term/permanent solutions to persistent bleeding. Long-term Depo can cause problems with calcium deficiency, see prescription for supplementation w/ Calcium + Vitamin D (also available OTC)

## 2017-03-10 ENCOUNTER — Telehealth: Payer: Self-pay

## 2017-03-10 LAB — GC/CHLAMYDIA PROBE AMP
CT Probe RNA: NOT DETECTED
GC Probe RNA: NOT DETECTED

## 2017-03-10 NOTE — Telephone Encounter (Signed)
Pt called back given lab results. 

## 2017-03-15 ENCOUNTER — Encounter: Payer: Self-pay | Admitting: Osteopathic Medicine

## 2017-03-16 ENCOUNTER — Other Ambulatory Visit: Payer: Self-pay | Admitting: Osteopathic Medicine

## 2017-03-16 MED ORDER — CLINDAMYCIN HCL 300 MG PO CAPS: 300.0000 mg | ORAL_CAPSULE | Freq: Two times a day (BID) | ORAL | 0 refills | Status: DC

## 2017-03-16 NOTE — Progress Notes (Signed)
Sending clinda for BV

## 2017-04-10 ENCOUNTER — Other Ambulatory Visit: Payer: Self-pay | Admitting: Osteopathic Medicine

## 2017-04-11 ENCOUNTER — Ambulatory Visit (INDEPENDENT_AMBULATORY_CARE_PROVIDER_SITE_OTHER): Payer: Managed Care, Other (non HMO) | Admitting: Osteopathic Medicine

## 2017-04-11 ENCOUNTER — Encounter: Payer: Self-pay | Admitting: Osteopathic Medicine

## 2017-04-11 VITALS — BP 118/82 | HR 98 | Ht 61.0 in | Wt 170.0 lb

## 2017-04-11 DIAGNOSIS — G44209 Tension-type headache, unspecified, not intractable: Secondary | ICD-10-CM

## 2017-04-11 NOTE — Progress Notes (Signed)
HPI: Judy Flores is a 36 y.o. female  who presents to [facility] today, 04/11/17,  for chief complaint of:  Chief Complaint  Patient presents with  . Other    FMLA PAPERWORK    Received FMLA paperwork in my inbox but, patient and I had not discussed filling this out. She is here today to discuss forms and have these completed. History of tension headache which may cause missed days. 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 Encounter for prescription for depo-Provera 03/09/2017  . Abnormal uterine bleeding 03/09/2017  . Bacterial vaginosis 03/09/2017  . Screen for STD (sexually transmitted disease) 03/09/2017  . Tension headache 12/31/2015   Past Surgical History:  Procedure Laterality Date  . CRYOTHERAPY    . TUBAL LIGATION     Social History  Substance Use Topics  . Smoking status: Never Smoker  . Smokeless tobacco: Never Used  . Alcohol use No   Family History  Problem Relation Age of Onset  . Hypertension Maternal Grandmother   . Diabetes Maternal Grandmother   . Breast cancer Paternal Grandmother      Current medication list and allergy/intolerance information reviewed:   Current Outpatient Prescriptions  Medication Sig Dispense Refill  . amitriptyline (ELAVIL) 25 MG tablet Take 1 tablet (25 mg total) by mouth at bedtime. Can increase to 2 tablets (50 mg total) by mouth at bedtime after 1 week. 60 tablet 2  . Calcium Carbonate-Vitamin D 600-400 MG-UNIT chew tablet Chew 2 tablets by mouth daily. 180 tablet 3  . clindamycin (CLEOCIN) 300 MG capsule Take 1 capsule (300 mg total) by mouth 2 (two) times daily. For one week for BV 14 capsule 0  . diclofenac (VOLTAREN) 75 MG EC tablet Take by mouth.    Marland Kitchen ibuprofen (ADVIL,MOTRIN) 800 MG tablet Take 1 tablet  (800 mg total) by mouth 3 (three) times daily. For 5 - 7 days (can discontinue use if bleeding stops) 30 tablet 0  . medroxyPROGESTERone (DEPO-PROVERA) 150 MG/ML injection Inject into the muscle.    . metroNIDAZOLE (METROGEL) 0.75 % vaginal gel Place 1 Applicatorful vaginally at bedtime. For 5 days 70 g 1  . Multiple Vitamins-Minerals (THERA-M) TABS Take by mouth.    . Multiple Vitamins-Minerals (WOMENS MULTI PO) Take by mouth.    . Norethindrone Acetate-Ethinyl Estrad-FE (LOESTRIN 24 FE) 1-20 MG-MCG(24) tablet Take 1 tablet by mouth daily. 1 Package 11   No current facility-administered medications for this visit.    No Known Allergies    Review of Systems:  Constitutional:  No recent illness, feels well today.  HEENT: No headache  Exam:  BP 118/82   Pulse 98   Ht  (1.549 m)   Wt 170 lb (77.1 kg)   BMI 32.12 kg/m   Constitutional: VS see above. General Appearance: alert, well-developed, well-nourished, NAD    ASSESSMENT/PLAN:   Tension headache - FMLA forms completed     Visit summary with medication list and pertinent instructions was printed for patient to review. All questions at time of visit were answered - patient instructed to contact office with any additional concerns. ER/RTC precautions were reviewed with the patient. Follow-up plan: Return for routine care / as needed .  Note: Total time spent 5 minutes, greater than 50% of the visit  was spent face-to-face counseling and coordinating care for the following: The encounter diagnosis was Tension headache.Marland Kitchen

## 2017-05-12 ENCOUNTER — Encounter: Payer: Self-pay | Admitting: Osteopathic Medicine

## 2017-05-15 ENCOUNTER — Encounter: Payer: Self-pay | Admitting: Osteopathic Medicine

## 2017-05-15 NOTE — Telephone Encounter (Signed)
No problem, happy to take her!

## 2017-05-17 ENCOUNTER — Ambulatory Visit (INDEPENDENT_AMBULATORY_CARE_PROVIDER_SITE_OTHER): Payer: Managed Care, Other (non HMO) | Admitting: Sports Medicine

## 2017-05-17 ENCOUNTER — Encounter: Payer: Self-pay | Admitting: Sports Medicine

## 2017-05-17 DIAGNOSIS — M67432 Ganglion, left wrist: Secondary | ICD-10-CM

## 2017-05-17 DIAGNOSIS — M67431 Ganglion, right wrist: Secondary | ICD-10-CM | POA: Diagnosis not present

## 2017-05-17 NOTE — Progress Notes (Signed)
Subjective:    I'm seeing this patient as a consultation for:  Dr. Sunnie NielsenNatalie Flores  CC: Ganglion cysts  HPI: This is a pleasant 36 year old female, for several months now she's had enlarging cysts on both wrists, left worse than right, minimally tender. Symptoms are mild, persistent. She does desire interventional treatment today. NSAIDs and activity modification have not been effective.  Past medical history:  Negative.  See flowsheet/record as well for more information.  Surgical history: Negative.  See flowsheet/record as well for more information.  Family history: Negative.  See flowsheet/record as well for more information.  Social history: Negative.  See flowsheet/record as well for more information.  Allergies, and medications have been entered into the medical record, reviewed, and no changes needed.   Review of Systems: No headache, visual changes, nausea, vomiting, diarrhea, constipation, dizziness, abdominal pain, skin rash, fevers, chills, night sweats, weight loss, swollen lymph nodes, body aches, joint swelling, muscle aches, chest pain, shortness of breath, mood changes, visual or auditory hallucinations.   Objective:   General: Well Developed, well nourished, and in no acute distress.  Neuro/Psych: Alert and oriented x3, extra-ocular muscles intact, able to move all 4 extremities, sensation grossly intact. Skin: Warm and dry, no rashes noted.  Respiratory: Not using accessory muscles, speaking in full sentences, trachea midline.  Cardiovascular: Pulses palpable, no extremity edema. Abdomen: Does not appear distended. Bilateral wrists: On the left wrist there is a ganglion cyst in the anatomical snuffbox, on the right wrist it is in the dorsal radiocarpal joint ROM smooth and normal with good flexion and extension and ulnar/radial deviation that is symmetrical with opposite wrist. Palpation is normal over metacarpals, navicular, lunate, and TFCC; tendons without  tenderness/ swelling No snuffbox tenderness. No tenderness over Canal of Guyon. Strength 5/5 in all directions without pain. Negative tinel's and phalens signs. Negative Finkelstein sign. Negative Watson's test.  Procedure: Real-time Ultrasound Guided Injection of right dorsal radial ganglion cyst Device: GE Logiq E  Verbal informed consent obtained.  Time-out conducted.  Noted no overlying erythema, induration, or other signs of local infection.  Skin prepped in a sterile fashion.  Local anesthesia: Topical Ethyl chloride.  With sterile technique and under real time ultrasound guidance:  I fenestrated the cyst with a 25-gauge needle and then injected 1/2 mL kenalog 40, 1/2 mL lidocaine Completed without difficulty  Pain immediately resolved suggesting accurate placement of the medication.  Advised to call if fevers/chills, erythema, induration, drainage, or persistent bleeding.  Images permanently stored and available for review in the ultrasound unit.  Impression: Technically successful ultrasound guided injection.  Procedure: Real-time Ultrasound Guided aspiration/injection of left lateral wrist ganglion cyst Device: GE Logiq E  Verbal informed consent obtained.  Time-out conducted.  Noted no overlying erythema, induration, or other signs of local infection.  Skin prepped in a sterile fashion.  Local anesthesia: Topical Ethyl chloride.  With sterile technique and under real time ultrasound guidance:  Aspirated scant amount of thick fluid with an 18-gauge needle and then injected 1/2 mL kenalog 40, 1/2 mL lidocaine Completed without difficulty  Pain immediately resolved suggesting accurate placement of the medication.  Advised to call if fevers/chills, erythema, induration, drainage, or persistent bleeding.  Images permanently stored and available for review in the ultrasound unit.  Impression: Technically successful ultrasound guided injection.  Impression and Recommendations:    This case required medical decision making of moderate complexity.  Ganglion cyst of both wrists Injection and fenestration on the right side. Aspiration and  injection on the left side. Return in one month.

## 2017-05-17 NOTE — Assessment & Plan Note (Signed)
Injection and fenestration on the right side. Aspiration and injection on the left side. Return in one month.

## 2017-06-16 ENCOUNTER — Ambulatory Visit: Payer: Managed Care, Other (non HMO) | Admitting: Sports Medicine

## 2017-06-16 DIAGNOSIS — Z0189 Encounter for other specified special examinations: Secondary | ICD-10-CM

## 2017-06-20 ENCOUNTER — Ambulatory Visit (INDEPENDENT_AMBULATORY_CARE_PROVIDER_SITE_OTHER): Payer: Managed Care, Other (non HMO) | Admitting: Sports Medicine

## 2017-06-20 DIAGNOSIS — M67431 Ganglion, right wrist: Secondary | ICD-10-CM | POA: Diagnosis not present

## 2017-06-20 DIAGNOSIS — M67432 Ganglion, left wrist: Secondary | ICD-10-CM | POA: Diagnosis not present

## 2017-06-20 NOTE — Assessment & Plan Note (Signed)
After injection fenestration on the right and aspiration and injection on the left patient returns completely symptom free with no recurrence of the ganglion cysts.

## 2017-06-20 NOTE — Progress Notes (Signed)
  Subjective:    CC: Follow-up  HPI: This is a pleasant 36 year old female, last month we treated ganglion cysts on both wrists and she returns today symptom and pain-free. Happy with results.  Past medical history:  Negative.  See flowsheet/record as well for more information.  Surgical history: Negative.  See flowsheet/record as well for more information.  Family history: Negative.  See flowsheet/record as well for more information.  Social history: Negative.  See flowsheet/record as well for more information.  Allergies, and medications have been entered into the medical record, reviewed, and no changes needed.   Review of Systems: No fevers, chills, night sweats, weight loss, chest pain, or shortness of breath.   Objective:    General: Well Developed, well nourished, and in no acute distress.  Neuro: Alert and oriented x3, extra-ocular muscles intact, sensation grossly intact.  HEENT: Normocephalic, atraumatic, pupils equal round reactive to light, neck supple, no masses, no lymphadenopathy, thyroid nonpalpable.  Skin: Warm and dry, no rashes. Cardiac: Regular rate and rhythm, no murmurs rubs or gallops, no lower extremity edema.  Respiratory: Clear to auscultation bilaterally. Not using accessory muscles, speaking in full sentences. Bilateral wrists: Inspection normal with no visible erythema or swelling. ROM smooth and normal with good flexion and extension and ulnar/radial deviation that is symmetrical with opposite wrist. Palpation is normal over metacarpals, navicular, lunate, and TFCC; tendons without tenderness/ swelling No snuffbox tenderness. No tenderness over Canal of Guyon. Strength 5/5 in all directions without pain. Negative tinel's and phalens signs. Negative Finkelstein sign. Negative Watson's test.  Impression and Recommendations:    Ganglion cyst of both wrists After injection fenestration on the right and aspiration and injection on the left patient returns  completely symptom free with no recurrence of the ganglion cysts.

## 2017-06-21 ENCOUNTER — Ambulatory Visit (INDEPENDENT_AMBULATORY_CARE_PROVIDER_SITE_OTHER): Payer: Managed Care, Other (non HMO) | Admitting: Osteopathic Medicine

## 2017-06-21 VITALS — BP 118/76 | HR 74

## 2017-06-21 DIAGNOSIS — Z3042 Encounter for surveillance of injectable contraceptive: Secondary | ICD-10-CM

## 2017-06-21 LAB — POCT URINE PREGNANCY: Preg Test, Ur: NEGATIVE

## 2017-06-21 MED ORDER — MEDROXYPROGESTERONE ACETATE 150 MG/ML IM SUSP
150.0000 mg | Freq: Once | INTRAMUSCULAR | Status: AC
  Administered 2017-06-21: 150 mg via INTRAMUSCULAR

## 2017-06-21 NOTE — Progress Notes (Signed)
Patient takes Depo-Provera more for abnormal bleeding and desire to not have periods, she has bilateral tubal ligation, pregnancy test negative 1 should be sufficient

## 2017-06-21 NOTE — Progress Notes (Signed)
Pt came into clinic today for depo-provera injection. Pt is not within the window for administration. Per PCP, since Pt has had her tubes tied she can take one pregnancy test then get injection. Pregnancy test was negative. Pt tolerated injection in right deltoid (per request) well, no immediate complications. Advised to return in September for next injection, printed calendar provided. No further questions.

## 2017-09-06 ENCOUNTER — Ambulatory Visit (INDEPENDENT_AMBULATORY_CARE_PROVIDER_SITE_OTHER): Payer: Managed Care, Other (non HMO) | Admitting: Osteopathic Medicine

## 2017-09-06 VITALS — BP 116/75 | HR 94

## 2017-09-06 DIAGNOSIS — Z309 Encounter for contraceptive management, unspecified: Secondary | ICD-10-CM | POA: Diagnosis not present

## 2017-09-06 MED ORDER — MEDROXYPROGESTERONE ACETATE 150 MG/ML IM SUSP
150.0000 mg | Freq: Once | INTRAMUSCULAR | Status: AC
  Administered 2017-09-06: 150 mg via INTRAMUSCULAR

## 2017-09-06 NOTE — Progress Notes (Signed)
Pt here for depo provera injection.  Tolerated injection well and without problems.  Denies any problems or concerns between injections.  Chart with next injection dates given to patient.

## 2017-09-26 ENCOUNTER — Encounter: Payer: Self-pay | Admitting: Osteopathic Medicine

## 2017-11-23 ENCOUNTER — Ambulatory Visit (INDEPENDENT_AMBULATORY_CARE_PROVIDER_SITE_OTHER): Payer: Managed Care, Other (non HMO) | Admitting: Osteopathic Medicine

## 2017-11-23 VITALS — BP 129/75 | HR 90

## 2017-11-23 DIAGNOSIS — Z30013 Encounter for initial prescription of injectable contraceptive: Secondary | ICD-10-CM | POA: Diagnosis not present

## 2017-11-23 MED ORDER — MEDROXYPROGESTERONE ACETATE 150 MG/ML IM SUSP
150.0000 mg | Freq: Once | INTRAMUSCULAR | Status: AC
  Administered 2017-11-23: 150 mg via INTRAMUSCULAR

## 2017-11-23 NOTE — Progress Notes (Signed)
Pt came into clinic today for depo-provera injection. Reports no negative side effects from the medication. Pt tolerated injection in ROUQ well, no immediate complications. Advised to return in February for next injection, printed calendar provided. No further questions.

## 2018-01-11 ENCOUNTER — Ambulatory Visit: Payer: Self-pay

## 2018-01-12 ENCOUNTER — Encounter: Payer: Self-pay | Admitting: Osteopathic Medicine

## 2018-01-12 ENCOUNTER — Ambulatory Visit (INDEPENDENT_AMBULATORY_CARE_PROVIDER_SITE_OTHER): Payer: Managed Care, Other (non HMO) | Admitting: Osteopathic Medicine

## 2018-01-12 VITALS — BP 118/61 | HR 75 | Temp 98.1°F | Ht 61.0 in | Wt 180.1 lb

## 2018-01-12 DIAGNOSIS — G44209 Tension-type headache, unspecified, not intractable: Secondary | ICD-10-CM

## 2018-01-12 MED ORDER — TOPIRAMATE ER 50 MG PO CAP24: 50.0000 mg | ORAL_CAPSULE | Freq: Every day | ORAL | 5 refills | Status: DC

## 2018-01-12 NOTE — Patient Instructions (Signed)
Trial Topiramate/Trokendi for headache prevention - let me know how you're doing on this medicine after 2-4 weeks (sooner if any problems)!

## 2018-01-12 NOTE — Progress Notes (Signed)
HPI: Judy Flores is a 37 y.o. female who  has a past medical history of Abnormal Pap smear of cervix and Tension headache.  [facility] today, 01/12/18,  for chief complaint of: FMLA forms for tension headache.  FMLA forms filled out.   Still having tension headaches few times per week. She is using Elavil but only taking as-needed. 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     Past medical, surgical, social and family history reviewed  Patient Active Problem List   Diagnosis Date Noted  . Ganglion cyst of both wrists 05/17/2017  . Encounter for prescription for depo-Provera 03/09/2017  . Abnormal uterine bleeding 03/09/2017  . Bacterial vaginosis 03/09/2017  . Screen for STD (sexually transmitted disease) 03/09/2017  . Tension headache 12/31/2015    Past Surgical History:  Procedure Laterality Date  . CRYOTHERAPY    . TUBAL LIGATION      Current medication list and allergy/intolerance information reviewed.   Review of Systems:  Constitutional:  No  fever, no chills, No recent illness  HEENT: +headache, no vision change, no hearing change, No sore throat, No  sinus pressure  Cardiac: No  chest pain, No  pressure, No palpitations  Respiratory:  No  shortness of breath.   Musculoskeletal: No new myalgia/arthralgia  Neurologic: No  weakness, No  dizziness   Exam:  BP 118/61   Pulse 75   Temp 98.1 F (36.7 C) (Oral)   Ht 5\' 1"  (1.549 m)   Wt 180 lb 1.3 oz (81.7 kg)   BMI 34.03 kg/m   Constitutional: VS see above. General Appearance: alert, well-developed, well-nourished, NAD  Eyes: Normal lids and conjunctive, non-icteric sclera  Ears, Nose, Mouth, Throat: MMM, Normal external inspection ears/nares/mouth/lips/gums.  Neck: No masses, trachea midline  Respiratory: Normal respiratory effort. no wheeze, no rhonchi, no rales  Cardiovascular: S1/S2 normal,  no murmur, no rub/gallop auscultated. RRR. No lower extremity edema.   Musculoskeletal: Gait normal.  Neurological: Normal balance/coordination. No tremor.   Skin: warm, dry, intact.     Psychiatric: Normal judgment/insight. Normal mood and affect. Oriented x3.    No results found for this or any previous visit (from the past 72 hour(s)).  No results found.   ASSESSMENT/PLAN: The encounter diagnosis was Tension headache.   Some miscommunication regarding use of amitriptyline. She is taking it as needed for headaches in the evenings, but waking up with grogginess/drunken feeling so she tries not to take it. Given that this medication is meant to be at daily use for prevention, we should try something else.  Will trial long-acting topiramate on daily basis for headaches. If tolerating this well but not controlling headache recurrence sufficiently, can increase the dose.  Meds ordered this encounter  Medications  . Topiramate ER (TROKENDI XR) 50 MG CP24    Sig: Take 50 mg by mouth daily.    Dispense:  30 capsule    Refill:  5     Patient Instructions  Trial Topiramate/Trokendi for headache prevention - let me know how you're doing on this medicine after 2-4 weeks (sooner if any problems)!     Visit summary with medication list and pertinent instructions was printed for patient to review. All questions at time of visit were answered - patient instructed to contact office with any additional concerns. ER/RTC precautions were reviewed with the patient.   Follow-up plan: Return if headache symptoms worsen or  fail to improve.  Note: Total time spent 25 minutes, greater than 50% of the visit was spent face-to-face counseling and coordinating care for the following: The encounter diagnosis was Tension headache..  Please note: voice recognition software was used to produce this document, and typos may escape review. Please contact Dr. Lyn Hollingshead for any needed clarifications.

## 2018-02-08 ENCOUNTER — Ambulatory Visit: Payer: Managed Care, Other (non HMO)

## 2018-02-13 ENCOUNTER — Ambulatory Visit: Payer: Managed Care, Other (non HMO)

## 2018-02-22 ENCOUNTER — Ambulatory Visit (INDEPENDENT_AMBULATORY_CARE_PROVIDER_SITE_OTHER): Payer: Managed Care, Other (non HMO) | Admitting: Osteopathic Medicine

## 2018-02-22 VITALS — BP 116/79 | HR 96 | Temp 98.7°F | Resp 16 | Wt 181.0 lb

## 2018-02-22 DIAGNOSIS — F418 Other specified anxiety disorders: Secondary | ICD-10-CM | POA: Diagnosis not present

## 2018-02-22 DIAGNOSIS — Z8742 Personal history of other diseases of the female genital tract: Secondary | ICD-10-CM

## 2018-02-22 DIAGNOSIS — N939 Abnormal uterine and vaginal bleeding, unspecified: Secondary | ICD-10-CM

## 2018-02-22 MED ORDER — ALPRAZOLAM 0.5 MG PO TABS: 0.5000 mg | ORAL_TABLET | Freq: Two times a day (BID) | ORAL | 0 refills | Status: DC | PRN

## 2018-02-22 MED ORDER — MEDROXYPROGESTERONE ACETATE 150 MG/ML IM SUSP
150.0000 mg | Freq: Once | INTRAMUSCULAR | Status: AC
  Administered 2018-02-22: 150 mg via INTRAMUSCULAR

## 2018-02-22 NOTE — Progress Notes (Signed)
HPI: Judy Flores is a 37 y.o. female who  has a past medical history of Abnormal Pap smear of cervix and Tension headache.  she presents to [facility] today, 02/22/18,  for chief complaint of: Depo shot Depression/Anxiety   Patient initially just on the schedule for Depo shot, receptionist noticed that she seemed a bit on edge, pastor PHQ 9 which she scored fairly positively on so patient was worked into the schedule.  Patient states her daughter has recently been incarcerated. Judy Flores is understandably under a lot of stress due to this and what is making it more difficult for her is her employer is not being very accommodating with letting her out of work to attend her daughter's court dates. She has already missed one and she is really distressed about this.  She is concerned about taking medications, does not want to "get addicted" and doesn't want to be on any daily medications. No history of underlying anxiety/depression issues   Past medical, surgical, social and family history reviewed:  Patient Active Problem List   Diagnosis Date Noted  . Ganglion cyst of both wrists 05/17/2017  . Encounter for prescription for depo-Provera 03/09/2017  . Abnormal uterine bleeding 03/09/2017  . Bacterial vaginosis 03/09/2017  . Screen for STD (sexually transmitted disease) 03/09/2017  . Tension headache 12/31/2015    Past Surgical History:  Procedure Laterality Date  . CRYOTHERAPY    . TUBAL LIGATION      Social History   Tobacco Use  . Smoking status: Never Smoker  . Smokeless tobacco: Never Used  Substance Use Topics  . Alcohol use: No    Alcohol/week: 0.0 oz    Family History  Problem Relation Age of Onset  . Hypertension Maternal Grandmother   . Diabetes Maternal Grandmother   . Breast cancer Paternal Grandmother      Current medication list and allergy/intolerance information reviewed:    Current Outpatient Medications   Medication Sig Dispense Refill  . Calcium Carbonate-Vitamin D 600-400 MG-UNIT chew tablet Chew 2 tablets by mouth daily. 180 tablet 3  . ibuprofen (ADVIL,MOTRIN) 800 MG tablet Take 1 tablet (800 mg total) by mouth 3 (three) times daily. For 5 - 7 days (can discontinue use if bleeding stops) 30 tablet 0  . medroxyPROGESTERone (DEPO-PROVERA) 150 MG/ML injection Inject into the muscle.    . metroNIDAZOLE (METROGEL) 0.75 % vaginal gel Place 1 Applicatorful vaginally at bedtime. For 5 days 70 g 1  . Multiple Vitamins-Minerals (THERA-M) TABS Take by mouth.    . Multiple Vitamins-Minerals (WOMENS MULTI PO) Take by mouth.    . Topiramate ER (TROKENDI XR) 50 MG CP24 Take 50 mg by mouth daily. 30 capsule 5   No current facility-administered medications for this visit.     No Known Allergies    Review of Systems:  Constitutional:  No  fever, no chills, No recent illness  Cardiac: No  chest pain  Respiratory:  No  shortness of breath. No  Cough  Gastrointestinal: No  abdominal pain, No  nausea  Neurologic: No  weakness, No  dizziness,  Psychiatric: No  concerns with depression, +concerns with anxiety, +sleep problems, No mood problems  Exam:  BP 116/79 (BP Location: Left Arm, Patient Position: Sitting, Cuff Size: Large)   Pulse 96   Temp 98.7 F (37.1 C)   Resp 16   Wt 181 lb (82.1 kg)   SpO2 100%   BMI 34.20 kg/m   Constitutional: VS see above. General  Appearance: alert, well-developed, well-nourished, NAD  Eyes: Normal lids and conjunctive, non-icteric sclera  Ears, Nose, Mouth, Throat: MMM, Normal external inspection ears/nares/mouth/lips/gums.   Neck: No masses, trachea midline.  Respiratory: Normal respiratory effort. no wheeze, no rhonchi, no rales  Cardiovascular: S1/S2 normal, no murmur, no rub/gallop auscultated. RRR.   Musculoskeletal: Gait normal.   Neurological: Normal balance/coordination. No tremor.   Skin: warm, dry, intact. No rash/ulcer. Marland Kitchen.     Psychiatric: Normal judgment/insight. Anxious mood and affect. Oriented x3.       ASSESSMENT/PLAN: Discussed during use of benzodiazepine anxiolytic for situational anxiety. Behavioral health referral for counseling will likely be of greatest benefit, patient was written out of work as well for a few days to help stabilize her emotional/mental health state. If there is anything else she needs for me in order to get time off of work to help her daughter, I am happy to help! Patient states she feels better after talking with me, I don't believe she is at any risk of hurting self or others, just going through a really difficult time with her family.  Situational anxiety - Plan: ALPRAZolam (XANAX) 0.5 MG tablet, Ambulatory referral to Behavioral Health  History of heavy periods - Plan: medroxyPROGESTERone (DEPO-PROVERA) injection 150 mg     Visit summary with medication list and pertinent instructions was printed for patient to review. All questions at time of visit were answered - patient instructed to contact office with any additional concerns. ER/RTC precautions were reviewed with the patient.   Follow-up plan: Return if symptoms worsen or fail to improve, if refill needed for alprazolam or dscuss daily medication .  Note: Total time spent 25 minutes, greater than 50% of the visit was spent face-to-face counseling and coordinating care for the following: The primary encounter diagnosis was Situational anxiety. A diagnosis of History of heavy periods was also pertinent to this visit.Marland Kitchen.  Please note: voice recognition software was used to produce this document, and typos may escape review. Please contact Dr. Lyn HollingsheadAlexander for any needed clarifications.

## 2018-02-22 NOTE — Progress Notes (Signed)
   Subjective:    Patient ID: Judy Flores, female    DOB: 1981/11/07, 37 y.o.   MRN: [id number]  HPI Patient herer for a Depo Provera injection. Denies chest pain, shortness of breath, headaches, or problems with medication. She is having some anxiety issues.     Review of Systems     Objective:   Physical Exam        Assessment & Plan:  Patient tolerated injection well without complications. Her next injection will be due between May 16-30, 2019. She will be seen and evaluated by Dr.Alexander today for her anxiety issue.

## 2018-02-23 ENCOUNTER — Encounter: Payer: Self-pay | Admitting: Osteopathic Medicine

## 2018-02-26 ENCOUNTER — Encounter: Payer: Self-pay | Admitting: Osteopathic Medicine

## 2018-02-27 ENCOUNTER — Encounter: Payer: Self-pay | Admitting: Osteopathic Medicine

## 2018-03-02 ENCOUNTER — Telehealth: Payer: Self-pay | Admitting: Osteopathic Medicine

## 2018-03-02 NOTE — Telephone Encounter (Signed)
Call patient to clarify 03/02/2018 at 12:17 PM. Left message to call clinic back.

## 2018-03-02 NOTE — Telephone Encounter (Signed)
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 If can call before 4:30

## 2018-03-02 NOTE — Telephone Encounter (Signed)
Patient left a vm msg stating she was returning a call back to provider. Attempted to contact patient at work, however no answer or option to leave a vm msg. Forwarding to provider.

## 2018-03-05 NOTE — Telephone Encounter (Signed)
The patient. Clarified she did work the 28th Feb, I wrote for work note for 3 days out which may have been unclear but should've included the first, fourth, fifth March. Wrote that she can return to work on the sixth. Note in fax box to go to [phone]

## 2018-03-12 ENCOUNTER — Encounter: Payer: Self-pay | Admitting: Sports Medicine

## 2018-03-12 ENCOUNTER — Ambulatory Visit (INDEPENDENT_AMBULATORY_CARE_PROVIDER_SITE_OTHER): Payer: Managed Care, Other (non HMO) | Admitting: Sports Medicine

## 2018-03-12 DIAGNOSIS — R590 Localized enlarged lymph nodes: Secondary | ICD-10-CM | POA: Diagnosis not present

## 2018-03-12 NOTE — Progress Notes (Signed)
  Subjective:    CC: Mass on neck  HPI: Earlier today this pleasant 37 year old female noticed a fairly large tender mass on the left side of her posterior neck, she denies any upper respiratory symptoms, head or neck infections, scalp infections.  No constitutional symptoms, throughout the day it has improved considerably.  Almost gone now.  I reviewed the past medical history, family history, social history, surgical history, and allergies today and no changes were needed.  Please see the problem list section below in epic for further details.  Past Medical History: Past Medical History:  Diagnosis Date  . Abnormal Pap smear of cervix   . Tension headache    Past Surgical History: Past Surgical History:  Procedure Laterality Date  . CRYOTHERAPY    . TUBAL LIGATION     Social History: Social History   Socioeconomic History  . Marital status: Single    Spouse name: None  . Number of children: None  . Years of education: None  . Highest education level: None  Social Needs  . Financial resource strain: None  . Food insecurity - worry: None  . Food insecurity - inability: None  . Transportation needs - medical: None  . Transportation needs - non-medical: None  Occupational History  . Occupation: phlebotomist  Tobacco Use  . Smoking status: Never Smoker  . Smokeless tobacco: Never Used  Substance and Sexual Activity  . Alcohol use: No    Alcohol/week: 0.0 oz  . Drug use: No  . Sexual activity: Yes    Partners: Male    Birth control/protection: Surgical  Other Topics Concern  . None  Social History Narrative  . None   Family History: Family History  Problem Relation Age of Onset  . Hypertension Maternal Grandmother   . Diabetes Maternal Grandmother   . Breast cancer Paternal Grandmother    Allergies: No Known Allergies Medications: See med rec.  Review of Systems: No fevers, chills, night sweats, weight loss, chest pain, or shortness of breath.    Objective:    General: Well Developed, well nourished, and in no acute distress.  Neuro: Alert and oriented x3, extra-ocular muscles intact, sensation grossly intact.  HEENT: Normocephalic, atraumatic, pupils equal round reactive to light, neck supple, no masses, minimally tender left posterior cervical chain lymphadenopathy x2, thyroid nonpalpable.  Skin: Warm and dry, no rashes. Cardiac: Regular rate and rhythm, no murmurs rubs or gallops, no lower extremity edema.  Respiratory: Clear to auscultation bilaterally. Not using accessory muscles, speaking in full sentences.  Impression and Recommendations:    Lymphadenopathy, posterior cervical Left posterior cervical chain lymphadenopathy. Watchful waiting for the next month. Otherwise asymptomatic and a negative head and neck exam. ___________________________________________ Ihor Austinhomas J. Benjamin Stainhekkekandam, M.D., ABFM., CAQSM. Primary Care and Sports Medicine [facility]  Adjunct Instructor of Family Medicine  [employer]

## 2018-03-12 NOTE — Assessment & Plan Note (Signed)
Left posterior cervical chain lymphadenopathy. Watchful waiting for the next month. Otherwise asymptomatic and a negative head and neck exam.

## 2018-03-12 NOTE — Patient Instructions (Signed)
Lymphadenopathy Lymphadenopathy refers to swollen or enlarged lymph glands, also called lymph nodes. Lymph glands are part of your body's defense (immune) system, which protects the body from infections, germs, and diseases. Lymph glands are found in many locations in your body, including the neck, underarm, and groin. Many things can cause lymph glands to become enlarged. When your immune system responds to germs, such as viruses or bacteria, infection-fighting cells and fluid build up. This causes the glands to grow in size. Usually, this is not something to worry about. The swelling and any soreness often go away without treatment. However, swollen lymph glands can also be caused by a number of diseases. Your health care provider may do various tests to help determine the cause. If the cause of your swollen lymph glands cannot be found, it is important to monitor your condition to make sure the swelling goes away. Follow these instructions at home: Watch your condition for any changes. The following actions may help to lessen any discomfort you are feeling:  Get plenty of rest.  Take medicines only as directed by your health care provider. Your health care provider may recommend over-the-counter medicines for pain.  Apply moist heat compresses to the site of swollen lymph nodes as directed by your health care provider. This can help reduce any pain.  Check your lymph nodes daily for any changes.  Keep all follow-up visits as directed by your health care provider. This is important.  Contact a health care provider if:  Your lymph nodes are still swollen after 2 weeks.  Your swelling increases or spreads to other areas.  Your lymph nodes are hard, seem fixed to the skin, or are growing rapidly.  Your skin over the lymph nodes is red and inflamed.  You have a fever.  You have chills.  You have fatigue.  You develop a sore throat.  You have abdominal pain.  You have weight  loss.  You have night sweats. Get help right away if:  You notice fluid leaking from the area of the enlarged lymph node.  You have severe pain in any area of your body.  You have chest pain.  You have shortness of breath. This information is not intended to replace advice given to you by your health care provider. Make sure you discuss any questions you have with your health care provider. Document Released: 09/20/2008 Document Revised: 05/19/2016 Document Reviewed: 07/17/2014 Elsevier Interactive Patient Education  2018 Elsevier Inc.  

## 2018-03-30 ENCOUNTER — Other Ambulatory Visit: Payer: Self-pay | Admitting: Osteopathic Medicine

## 2018-03-30 DIAGNOSIS — F418 Other specified anxiety disorders: Secondary | ICD-10-CM

## 2018-03-30 MED ORDER — ALPRAZOLAM 0.5 MG PO TABS: 0.5000 mg | ORAL_TABLET | Freq: Two times a day (BID) | ORAL | 0 refills | Status: DC | PRN

## 2018-03-30 NOTE — Telephone Encounter (Signed)
Routing to covering Provider for review.  

## 2018-04-12 ENCOUNTER — Ambulatory Visit: Payer: Managed Care, Other (non HMO) | Admitting: Sports Medicine

## 2018-04-12 DIAGNOSIS — Z0189 Encounter for other specified special examinations: Secondary | ICD-10-CM

## 2018-06-15 ENCOUNTER — Other Ambulatory Visit: Payer: Self-pay | Admitting: Sports Medicine

## 2018-06-15 DIAGNOSIS — F418 Other specified anxiety disorders: Secondary | ICD-10-CM

## 2018-06-15 MED ORDER — ALPRAZOLAM 0.5 MG PO TABS: 0.5000 mg | ORAL_TABLET | Freq: Two times a day (BID) | ORAL | 0 refills | Status: AC | PRN

## 2018-06-15 NOTE — Telephone Encounter (Signed)
To PCP

## 2018-06-18 ENCOUNTER — Ambulatory Visit: Payer: Managed Care, Other (non HMO) | Admitting: Osteopathic Medicine

## 2018-06-25 ENCOUNTER — Encounter: Payer: Self-pay | Admitting: Osteopathic Medicine

## 2018-06-25 ENCOUNTER — Ambulatory Visit (INDEPENDENT_AMBULATORY_CARE_PROVIDER_SITE_OTHER): Payer: Managed Care, Other (non HMO) | Admitting: Osteopathic Medicine

## 2018-06-25 VITALS — BP 118/75 | HR 75 | Temp 98.3°F | Wt 180.1 lb

## 2018-06-25 DIAGNOSIS — F320 Major depressive disorder, single episode, mild: Secondary | ICD-10-CM | POA: Diagnosis not present

## 2018-06-25 DIAGNOSIS — F411 Generalized anxiety disorder: Secondary | ICD-10-CM | POA: Diagnosis not present

## 2018-06-25 MED ORDER — ESCITALOPRAM OXALATE 10 MG PO TABS: 10.0000 mg | ORAL_TABLET | Freq: Every day | ORAL | 1 refills | Status: DC

## 2018-06-25 NOTE — Progress Notes (Signed)
HPI: Judy Flores is a 37 y.o. female who  has a past medical history of Abnormal Pap smear of cervix and Tension headache.  [facility], [facility],  for chief complaint of:  Anxiety follow-up  Doing okay, still going through a rough time w/ her daughter being incarcerated - "I know I won't feel better until she's home." PRN alprazolam is helping. Shes having a lot of anxiety/depression around her situation, coping overall okay but gets anxious around phone calls, occasional sleeping problems.     Past medical history, surgical history, and family history reviewed.  Current medication list and allergy/intolerance information reviewed.   (See remainder of HPI, ROS, Phys Exam below)    ASSESSMENT/PLAN: The primary encounter diagnosis was Generalized anxiety disorder. A diagnosis of Depression, major, single episode, mild (HCC) was also pertinent to this visit.   Discussion of risks/benefits of SSRI trial for longstanding anx/dep symptoms even if these do have a situational component may benefit from these medications.   Pt willing to try something for her symptoms. Will start low-dose lexapro, ok to continue benzos, she is good about sparing use of these      Meds ordered this encounter  Medications  . escitalopram (LEXAPRO) 10 MG tablet    Sig: Take 1 tablet (10 mg total) by mouth daily.    Dispense:  90 tablet    Refill:  1     Follow-up plan: Return in about 6 weeks (around 08/06/2018) for recheck on lexapro - sooner if needed .     ############################################ ############################################ ############################################ ############################################    Outpatient Encounter Medications as of 06/25/2018  Medication Sig Note  . ALPRAZolam (XANAX) 0.5 MG tablet Take 1 tablet (0.5 mg total) by mouth 2 (two) times daily as needed for anxiety.   . Calcium  Carbonate-Vitamin D 600-400 MG-UNIT chew tablet Chew 2 tablets by mouth daily.   Marland Kitchen. ibuprofen (ADVIL,MOTRIN) 800 MG tablet Take 1 tablet (800 mg total) by mouth 3 (three) times daily. For 5 - 7 days (can discontinue use if bleeding stops)   . medroxyPROGESTERone (DEPO-PROVERA) 150 MG/ML injection Inject into the muscle. 10/11/2016: Received from: [facility] Received Sig: Inject 150 mg into the muscle every 3 (three) months.  . Multiple Vitamins-Minerals (THERA-M) TABS Take by mouth. 10/11/2016: Received from: [facility] Received Sig: Take 1 tablet by mouth daily.  . Multiple Vitamins-Minerals (WOMENS MULTI PO) Take by mouth.   . Topiramate ER (TROKENDI XR) 50 MG CP24 Take 50 mg by mouth daily.   . [DISCONTINUED] metroNIDAZOLE (METROGEL) 0.75 % vaginal gel Place 1 Applicatorful vaginally at bedtime. For 5 days   . escitalopram (LEXAPRO) 10 MG tablet Take 1 tablet (10 mg total) by mouth daily.    No facility-administered encounter medications on file as of 06/25/2018.    No Known Allergies    Review of Systems:  Constitutional: No recent illness  HEENT: No  headache, no vision change  Cardiac: No  chest pain, No  pressure, No palpitations  Respiratory:  No  shortness of breath. No  Cough  Gastrointestinal: No  abdominal pain, no change on bowel habits  Musculoskeletal: No new myalgia/arthralgia  Skin: No  Rash  Hem/Onc: No  easy bruising/bleeding, No  abnormal lumps/bumps  Neurologic: No  weakness, No  Dizziness  Psychiatric: No  concerns with depression, No  concerns with anxiety  Exam:  BP 118/75 (BP Location: Left Arm, Patient Position: Sitting, Cuff Size: Normal)  Pulse 75   Temp 98.3 F (36.8 C) (Oral)   Wt 180 lb 1.6 oz (81.7 kg)   BMI 34.03 kg/m   Constitutional: VS see above. General Appearance: alert, well-developed, well-nourished, NAD  Eyes: Normal lids and conjunctive, non-icteric sclera  Ears, Nose, Mouth, Throat: MMM, Normal external inspection  ears/nares/mouth/lips/gums.  Neck: No masses, trachea midline.   Respiratory: Normal respiratory effort. no wheeze, no rhonchi, no rales  Cardiovascular: S1/S2 normal, no murmur, no rub/gallop auscultated. RRR.   Musculoskeletal: Gait normal. Symmetric and independent movement of all extremities  Neurological: Normal balance/coordination. No tremor.  Skin: warm, dry, intact.   Psychiatric: Normal judgment/insight. Normal mood and affect. Oriented x3.   Visit summary with medication list and pertinent instructions was printed for patient to review, advised to alert [name] if any changes needed. All questions at time of visit were answered - patient instructed to contact office with any additional concerns. ER/RTC precautions were reviewed with the patient and understanding verbalized.   Follow-up plan: Return in about 6 weeks (around 08/06/2018) for recheck on lexapro - sooner if needed .  Note: Total time spent 25 minutes, greater than 50% of the visit was spent face-to-face counseling and coordinating care for the following: The primary encounter diagnosis was Generalized anxiety disorder. A diagnosis of Depression, major, single episode, mild (HCC) was also pertinent to this visit.Marland Kitchen  Please note: voice recognition software was used to produce this document, and typos may escape review. Please contact Dr. Lyn Hollingshead for any needed clarifications.

## 2018-06-26 ENCOUNTER — Encounter: Payer: Self-pay | Admitting: Osteopathic Medicine

## 2018-07-04 ENCOUNTER — Telehealth: Payer: Self-pay | Admitting: Osteopathic Medicine

## 2018-07-04 NOTE — Telephone Encounter (Signed)
Pt called. She wants to know if you received FMLA paperwork that needed to be redone?

## 2018-07-04 NOTE — Telephone Encounter (Signed)
Yes, this should have been sent out last week - have her call her FMLA coordinator and make sure it was received

## 2018-07-05 NOTE — Telephone Encounter (Signed)
Thank you, left vm this morning.

## 2018-09-08 ENCOUNTER — Other Ambulatory Visit: Payer: Self-pay | Admitting: Osteopathic Medicine

## 2018-11-09 ENCOUNTER — Encounter: Payer: Self-pay | Admitting: Osteopathic Medicine

## 2019-01-08 ENCOUNTER — Encounter: Payer: Self-pay | Admitting: Osteopathic Medicine

## 2019-01-08 ENCOUNTER — Ambulatory Visit (INDEPENDENT_AMBULATORY_CARE_PROVIDER_SITE_OTHER): Payer: 59 | Admitting: Osteopathic Medicine

## 2019-01-08 DIAGNOSIS — G43909 Migraine, unspecified, not intractable, without status migrainosus: Secondary | ICD-10-CM

## 2019-01-08 NOTE — Progress Notes (Signed)
HPI: Judy Flores is a 38 y.o. female who  has a past medical history of Abnormal Pap smear of cervix and Tension headache.  she presents to [facility] today, 01/08/19,  for chief complaint of:  Complete FMLA paperwork   Migraines, needs to miss 1-2 days of work a couple times per month for headache day and recovery day. FMLA needs to be renewed. See scanned documents   Pt is feeling well today, no concerns.     At today's visit... Past medical history, surgical history, and family history reviewed and updated as needed.  Current medication list and allergy/intolerance information reviewed and updated as needed. (See remainder of HPI, ROS, Phys Exam below)          ASSESSMENT/PLAN: The encounter diagnosis was Migraine without status migrainosus, not intractable, unspecified migraine type.    See scanned docs for FMLA form       Follow-up plan: Return in about 6 months (around 07/09/2019) for recheck headaches / annual physical. See me sooner if needed! .                             ############################################ ############################################ ############################################ ############################################    Current Meds  Medication Sig  . ALPRAZolam (XANAX) 0.5 MG tablet Take 1 tablet (0.5 mg total) by mouth 2 (two) times daily as needed for anxiety.  . Calcium Carbonate-Vitamin D 600-400 MG-UNIT chew tablet Chew 2 tablets by mouth daily.  Marland Kitchen escitalopram (LEXAPRO) 10 MG tablet Take 1 tablet (10 mg total) by mouth daily.  . Galcanezumab-gnlm 120 MG/ML SOAJ Inject into the skin.  Marland Kitchen ibuprofen (ADVIL,MOTRIN) 800 MG tablet Take 1 tablet (800 mg total) by mouth 3 (three) times daily. For 5 - 7 days (can discontinue use if bleeding stops)  . medroxyPROGESTERone (DEPO-PROVERA) 150 MG/ML injection Inject into the muscle.  . Multiple Vitamins-Minerals  (THERA-M) TABS Take by mouth.  . Multiple Vitamins-Minerals (WOMENS MULTI PO) Take by mouth.  . TROKENDI XR 50 MG CP24 TAKE 1 CAPSULE BY MOUTH DAILY    No Known Allergies     Review of Systems:  Constitutional: No recent illness  HEENT: No  headache, no vision change  Neurologic: No  weakness, No  Dizziness  Psychiatric: No  concerns with depression, No  concerns with anxiety  Exam:  BP 122/74 (BP Location: Left Arm, Patient Position: Sitting, Cuff Size: Normal)   Pulse 72   Temp 97.7 F (36.5 C) (Oral)   Wt 184 lb 8 oz (83.7 kg)   BMI 34.86 kg/m   Constitutional: VS see above. General Appearance: alert, well-developed, well-nourished, NAD  Eyes: Normal lids and conjunctive, non-icteric sclera  Neck: No masses, trachea midline.   Respiratory: Normal respiratory effort.   Musculoskeletal: Gait normal. Symmetric and independent movement of all extremities  Neurological: Normal balance/coordination. No tremor.  Psychiatric: Normal judgment/insight. Normal mood and affect. Oriented x3.       Visit summary with medication list and pertinent instructions was printed for patient to review, patient was advised to alert [name] if any updates are needed. All questions at time of visit were answered - patient instructed to contact office with any additional concerns. ER/RTC precautions were reviewed with the patient and understanding verbalized.   Note: Total time spent 10 minutes, greater than 50% of the visit was spent face-to-face counseling and coordinating care for the following: The encounter diagnosis was Migraine without status migrainosus, not intractable,  unspecified migraine type..  Please note: voice recognition software was used to produce this document, and typos may escape review. Please contact Dr. Lyn Hollingshead for any needed clarifications.    Follow up plan: Return in about 6 months (around 07/09/2019) for recheck headaches / annual physical. See me sooner if  needed! Marland Kitchen

## 2019-01-09 ENCOUNTER — Encounter: Payer: Self-pay | Admitting: Osteopathic Medicine

## 2019-01-09 DIAGNOSIS — G43909 Migraine, unspecified, not intractable, without status migrainosus: Secondary | ICD-10-CM | POA: Insufficient documentation

## 2019-01-09 HISTORY — DX: Migraine, unspecified, not intractable, without status migrainosus: G43.909

## 2019-02-14 ENCOUNTER — Ambulatory Visit (INDEPENDENT_AMBULATORY_CARE_PROVIDER_SITE_OTHER): Payer: 59 | Admitting: Osteopathic Medicine

## 2019-02-14 ENCOUNTER — Encounter: Payer: Self-pay | Admitting: Osteopathic Medicine

## 2019-02-14 ENCOUNTER — Other Ambulatory Visit (HOSPITAL_COMMUNITY)
Admission: RE | Admit: 2019-02-14 | Discharge: 2019-02-14 | Disposition: A | Discharge status: Home / Self Care | Payer: 59 | Source: Ambulatory Visit | Attending: Osteopathic Medicine | Admitting: Osteopathic Medicine

## 2019-02-14 VITALS — BP 123/76 | HR 79 | Temp 98.3°F | Wt 184.8 lb

## 2019-02-14 DIAGNOSIS — B3731 Acute candidiasis of vulva and vagina: Secondary | ICD-10-CM

## 2019-02-14 DIAGNOSIS — Z124 Encounter for screening for malignant neoplasm of cervix: Secondary | ICD-10-CM | POA: Insufficient documentation

## 2019-02-14 DIAGNOSIS — L918 Other hypertrophic disorders of the skin: Secondary | ICD-10-CM

## 2019-02-14 DIAGNOSIS — B373 Candidiasis of vulva and vagina: Secondary | ICD-10-CM

## 2019-02-14 LAB — WET PREP FOR TRICH, YEAST, CLUE
MICRO NUMBER:: 220907
Specimen Quality: ADEQUATE

## 2019-02-14 MED ORDER — FLUCONAZOLE 150 MG PO TABS: 150.0000 mg | ORAL_TABLET | Freq: Once | ORAL | 1 refills | Status: DC

## 2019-02-14 NOTE — Patient Instructions (Signed)
Sterile Tape Wound Care  Some cuts and wounds can be closed using sterile tape, also called skin adhesive strips. Skin adhesive strips can be used for shallow (superficial) and simple cuts, wounds, lacerations, and some surgical incisions. These strips act in place of stitches, or in addition to stitches, to hold the edges of the wound together to allow for better healing. Unlike stitches, the adhesive strips do not require needles or anesthetic medicine for placement. The strips usually fall off on their own as the wound is healing. It is important to take proper care of your wound at home while it heals.  How to care for a sterile tape wound     Try to keep the area around your wound clean and dry. Do not allow the adhesive strips to get wet for the first 12 hours.   Do not use any soaps or ointments on the wound for the first 12 hours.   If a bandage (dressing) has been applied, keep it dry.   Follow instructions from your health care provider about how often to change the dressing.  ? Wash your hands with soap and water before you change your dressing. If soap and water are not available, use hand sanitizer.  ? Change your dressing as told by your health care provider.  ? Leave adhesive strips in place. These skin closures may need to stay in place for 2 weeks or longer. If adhesive strip edges start to loosen and curl up, you may trim the loose edges. Do not remove adhesive strips completely unless your health care provider tells you to do that.   Do not scratch, rub, or pick at the wound area.   Protect the wound from further injury until it is healed.   Protect the wound from sun and tanning bed exposure while it is healing, and for several weeks after healing.   Check the wound every day for signs of infection. Check for:  ? More redness, swelling, or pain.  ? More fluid or blood.  ? Warmth.  ? Pus or a bad smell.  Follow these instructions at home:   Take over-the-counter and prescription medicines  only as told by your health care provider.   Keep all follow-up visits as told by your health care provider. This is important.  Contact a health care provider if:   Your adhesive strips become soaked with blood or fall off before the wound has healed. The tape will need to be replaced.   You have a fever.  Get help right away if:   You have chills.   You develop a rash after the strips are applied.   You have a red streak that goes away from the wound.   You have more redness, swelling, or pain around your wound.   You have more fluid or blood coming from your wound.   Your wound feels warm to the touch.   You have pus or a bad smell coming from your wound.   Your wound breaks open.  This information is not intended to replace advice given to you by your health care provider. Make sure you discuss any questions you have with your health care provider.  Document Released: 01/19/2005 Document Revised: 11/04/2016 Document Reviewed: 11/04/2016  Elsevier Interactive Patient Education  2019 Elsevier Inc.

## 2019-02-14 NOTE — Progress Notes (Signed)
HPI: Judy Flores is a 38 y.o. female who  has a past medical history of Abnormal Pap smear of cervix, Migraine (01/09/2019), and Tension headache.  she presents to [facility] today, 02/14/19,  for chief complaint of:  Vaginal concern  Skin tag  Monistat tried a few days ago which helped the itching but she's concerned about residue/dicsharge with wiping.   Skin tag on left inner thigh has been bothering her over the past couple of months more so than usual.  Catching on clothes, rubbing.      At today's visit 02/14/19 ... PMH, PSH, FH reviewed and updated as needed.  Current medication list and allergy/intolerance hx reviewed and updated as needed. (See remainder of HPI, ROS, Phys Exam below)           ASSESSMENT/PLAN: The primary encounter diagnosis was Vaginal candida. A diagnosis of Skin tag was also pertinent to this visit.   Orders Placed This Encounter  Procedures  . WET PREP FOR TRICH, YEAST, CLUE     Meds ordered this encounter  Medications  . fluconazole (DIFLUCAN) 150 MG tablet    Sig: Take 1 tablet (150 mg total) by mouth once for 1 dose. Repeat dose 72 hours if yeast infection persists    Dispense:  2 tablet    Refill:  1    Nothing abnormal that I can see as far as vaginal discharge, possible bacterial vaginosis.  Patient denies possibility of sexually transmitted infection.  We will go ahead and treat for yeast, I think the residual that she is concerned about may be due to the Monistat treatment, symptoms have largely resolved.  Procedure: Skin tag removal.  Broad-based skin tag on left inner thigh.  Appears benign.  Area was cleaned in the usual fashion with chlorhexidine.  Anesthetized with 1 cc lidocaine with epinephrine, grasped with forceps, cut with scissors, skin was undermined a bit and Steri-Strips were applied for wound closure.  Patient tolerated procedure well.  Standard postprocedure care was explained  to patient.  Did not feel necessary to send lesion to pathology.    Follow-up plan: Return if symptoms worsen or fail to improve.                                                 ################################################# ################################################# ################################################# #################################################    Current Meds  Medication Sig  . ALPRAZolam (XANAX) 0.5 MG tablet Take 1 tablet (0.5 mg total) by mouth 2 (two) times daily as needed for anxiety.  . Calcium Carbonate-Vitamin D 600-400 MG-UNIT chew tablet Chew 2 tablets by mouth daily.  Marland Kitchen escitalopram (LEXAPRO) 10 MG tablet Take 1 tablet (10 mg total) by mouth daily.  . Galcanezumab-gnlm 120 MG/ML SOAJ Inject into the skin.  Marland Kitchen ibuprofen (ADVIL,MOTRIN) 800 MG tablet Take 1 tablet (800 mg total) by mouth 3 (three) times daily. For 5 - 7 days (can discontinue use if bleeding stops)  . medroxyPROGESTERone (DEPO-PROVERA) 150 MG/ML injection Inject into the muscle.  . Multiple Vitamins-Minerals (THERA-M) TABS Take by mouth.  . Multiple Vitamins-Minerals (WOMENS MULTI PO) Take by mouth.  . TROKENDI XR 50 MG CP24 TAKE 1 CAPSULE BY MOUTH DAILY    No Known Allergies     Review of Systems:  Constitutional: No recent systemic illness, no fever/chills  Cardiac: No  chest pain  Respiratory:  No  shortness of breath.  Musculoskeletal: No new myalgia/arthralgia  Skin: No  Rash, +skin tag   GU: see hpi  Exam:  BP 123/76 (BP Location: Left Arm, Patient Position: Sitting, Cuff Size: Normal)   Pulse 79   Temp 98.3 F (36.8 C) (Oral)   Wt 184 lb 12.8 oz (83.8 kg)   BMI 34.92 kg/m   Constitutional: VS see above. General Appearance: alert, well-developed, well-nourished, NAD  Eyes: Normal lids and conjunctive, non-icteric sclera  Ears, Nose, Mouth, Throat: MMM, Normal external inspection  ears/nares/mouth/lips/gums.  Neck: No masses, trachea midline.   Respiratory: Normal respiratory effort.  Neurological: Normal balance/coordination. No tremor.  Skin: warm, dry, intact. Skin tag   Psychiatric: Normal judgment/insight. Normal mood and affect. Oriented x3.  GYN: No lesions/ulcers to external genitalia, normal urethra, normal vaginal mucosa, physiologic discharge w/ some possible yeast / residual yeast infection medication treatment, cervix normal without lesions, uterus not enlarged or tender, adnexa no masses and nontender      Visit summary with medication list and pertinent instructions was printed for patient to review, patient was advised to alert [name] if any updates are needed. All questions at time of visit were answered - patient instructed to contact office with any additional concerns. ER/RTC precautions were reviewed with the patient and understanding verbalized.     Please note: voice recognition software was used to produce this document, and typos may escape review. Please contact Dr. Lyn Hollingshead for any needed clarifications.    Follow up plan: Return if symptoms worsen or fail to improve.

## 2019-02-18 LAB — CYTOLOGY - PAP
DIAGNOSIS: NEGATIVE
HPV (WINDOPATH): DETECTED — AB
HPV 16/18/45 GENOTYPING: NEGATIVE

## 2019-02-22 ENCOUNTER — Encounter: Payer: Self-pay | Admitting: Osteopathic Medicine

## 2019-02-24 ENCOUNTER — Other Ambulatory Visit: Payer: Self-pay | Admitting: Osteopathic Medicine

## 2019-02-25 ENCOUNTER — Other Ambulatory Visit: Payer: Self-pay | Admitting: Osteopathic Medicine

## 2019-03-26 ENCOUNTER — Encounter: Payer: Self-pay | Admitting: Osteopathic Medicine

## 2019-03-28 ENCOUNTER — Other Ambulatory Visit: Payer: Self-pay

## 2019-03-28 ENCOUNTER — Encounter: Payer: Self-pay | Admitting: Osteopathic Medicine

## 2019-03-28 ENCOUNTER — Ambulatory Visit (INDEPENDENT_AMBULATORY_CARE_PROVIDER_SITE_OTHER): Payer: 59 | Admitting: Osteopathic Medicine

## 2019-03-28 DIAGNOSIS — H019 Unspecified inflammation of eyelid: Secondary | ICD-10-CM

## 2019-03-28 MED ORDER — ERYTHROMYCIN 5 MG/GM OP OINT: 1.0000 "application " | TOPICAL_OINTMENT | Freq: Three times a day (TID) | OPHTHALMIC | 0 refills | Status: AC

## 2019-03-28 NOTE — Progress Notes (Signed)
HPI: Judy Flores is a 38 y.o. female who  has a past medical history of Abnormal Pap smear of cervix, Migraine (01/09/2019), and Tension headache.  Presents today 03/28/19 via virtual video visit, chief complaint of L eye problem  4 days itchy lump under upper L eyelid and red inernal lids. Has used OTC cleanser on it. Reports light sensitivity but no vision changes or pain w/ eye movement, no external redness/swelling to skin on lids.     Past medical, surgical, social and family history reviewed and updated as necessary.   Current medication list and allergy/intolerance information reviewed:    Current Outpatient Medications  Medication Sig Dispense Refill  . ALPRAZolam (XANAX) 0.5 MG tablet Take 1 tablet (0.5 mg total) by mouth 2 (two) times daily as needed for anxiety. 15 tablet 0  . Calcium Carbonate-Vitamin D 600-400 MG-UNIT chew tablet Chew 2 tablets by mouth daily. 180 tablet 3  . escitalopram (LEXAPRO) 10 MG tablet Take 1 tablet (10 mg total) by mouth daily. 90 tablet 1  . fluconazole (DIFLUCAN) 150 MG tablet TAKE 1 TABLET(150 MG) BY MOUTH 1 TIME FOR 1 DOSE. REPEAT DOSE 72 HOURS IF YEAST INFECTION PERSISTS 2 tablet 1  . Galcanezumab-gnlm 120 MG/ML SOAJ Inject into the skin.    Marland Kitchen ibuprofen (ADVIL,MOTRIN) 800 MG tablet Take 1 tablet (800 mg total) by mouth 3 (three) times daily. For 5 - 7 days (can discontinue use if bleeding stops) 30 tablet 0  . medroxyPROGESTERone (DEPO-PROVERA) 150 MG/ML injection Inject into the muscle.    . Multiple Vitamins-Minerals (THERA-M) TABS Take by mouth.    . Multiple Vitamins-Minerals (WOMENS MULTI PO) Take by mouth.    . TROKENDI XR 50 MG CP24 TAKE 1 CAPSULE BY MOUTH DAILY 30 capsule 0  . erythromycin ophthalmic ointment Place 1 application into the left eye 3 (three) times daily for 5 days. Apply 1 inch ribbon to affected eye TID for 5 days. 15 g 0   No current facility-administered medications for this visit.     No Known Allergies     Review of Systems:  Constitutional:  No  fever, no chills, No recent illness  HEENT: No  headache, no vision change, no hearing change, No sore throat, +sinus pressure  Cardiac: No  chest pain, No  pressure, No palpitations  Respiratory:  No  shortness of breath. No  Cough  Musculoskeletal: No new myalgia/arthralgia  Skin: No  Rash  Neurologic: No  weakness, No  dizziness  Exam:  There were no vitals taken for this visit.  Constitutional: VS see above. General Appearance: alert, well-developed, well-nourished, NAD  Eyes: Normal conjunctive, non-icteric sclera, L upper lid externally appears normal, mucosa againt the globe is reddened, focus on screen is not perfect but there is concern for possible developing stye  Ears, Nose, Mouth, Throat: MMM, Normal external inspection ears/nares/mouth/lips/gums.   Neck: No masses, trachea midline. Respiratory: Normal respiratory effort.     ASSESSMENT/PLAN: The encounter diagnosis was Eyelid inflammation.   Meds ordered this encounter  Medications  . erythromycin ophthalmic ointment    Sig: Place 1 application into the left eye 3 (three) times daily for 5 days. Apply 1 inch ribbon to affected eye TID for 5 days.    Dispense:  15 g    Refill:  0      Visit summary with medication list and pertinent instructions was printed for patient to review. All questions at time of visit were answered - patient instructed to contact office  with any additional concerns or updates. ER/RTC precautions were reviewed with the patient.   Follow-up plan: Return if symptoms worsen or fail to improve.    Please note: voice recognition software was used to produce this document, and typos may escape review. Please contact Dr. Lyn Hollingshead for any needed clarifications.

## 2019-04-30 ENCOUNTER — Other Ambulatory Visit: Payer: Self-pay | Admitting: Osteopathic Medicine

## 2019-06-04 ENCOUNTER — Encounter: Payer: Self-pay | Admitting: Osteopathic Medicine

## 2019-06-04 ENCOUNTER — Ambulatory Visit (INDEPENDENT_AMBULATORY_CARE_PROVIDER_SITE_OTHER): Payer: 59 | Admitting: Osteopathic Medicine

## 2019-06-04 VITALS — BP 123/75 | HR 68 | Temp 98.6°F | Wt 186.5 lb

## 2019-06-04 DIAGNOSIS — Z3042 Encounter for surveillance of injectable contraceptive: Secondary | ICD-10-CM | POA: Diagnosis not present

## 2019-06-04 LAB — POCT URINE PREGNANCY: Preg Test, Ur: NEGATIVE

## 2019-06-04 MED ORDER — MEDROXYPROGESTERONE ACETATE 150 MG/ML IM SUSP
150.0000 mg | Freq: Once | INTRAMUSCULAR | Status: AC
  Administered 2019-06-04: 150 mg via INTRAMUSCULAR

## 2019-06-04 NOTE — Progress Notes (Signed)
Results for orders placed or performed in visit on 06/04/19 (from the past 24 hour(s))  POCT urine pregnancy     Status: None   Collection Time: 06/04/19  4:06 PM  Result Value Ref Range   Preg Test, Ur Negative Negative   Encounter for Depo-Provera contraception - Plan: POCT urine pregnancy, medroxyPROGESTERone (DEPO-PROVERA) injection 150 mg

## 2019-06-19 ENCOUNTER — Encounter: Payer: Self-pay | Admitting: Osteopathic Medicine

## 2019-06-19 ENCOUNTER — Ambulatory Visit (INDEPENDENT_AMBULATORY_CARE_PROVIDER_SITE_OTHER): Payer: 59 | Admitting: Osteopathic Medicine

## 2019-06-19 VITALS — BP 118/85 | HR 77 | Temp 98.7°F | Wt 186.2 lb

## 2019-06-19 DIAGNOSIS — S86112A Strain of other muscle(s) and tendon(s) of posterior muscle group at lower leg level, left leg, initial encounter: Secondary | ICD-10-CM | POA: Diagnosis not present

## 2019-06-19 DIAGNOSIS — R252 Cramp and spasm: Secondary | ICD-10-CM

## 2019-06-19 NOTE — Progress Notes (Signed)
HPI: Judy Flores is a 38 y.o. female who  has a past medical history of Abnormal Pap smear of cervix, Migraine (01/09/2019), and Tension headache.  she presents to [facility] today, 06/19/19,  for chief complaint of:  Leg cramp   Charlie horse in calf at night, but still having tenderness in the area and is very sore in that area. Was using a massage device to help this. Felt like a knot in the leg. Lasted for about 2 days.      At today's visit 06/19/19 ... PMH, PSH, FH reviewed and updated as needed.  Current medication list and allergy/intolerance hx reviewed and updated as needed. (See remainder of HPI, ROS, Phys Exam below)   No results found.  No results found for this or any previous visit (from the past 72 hour(s)).        ASSESSMENT/PLAN: The primary encounter diagnosis was Muscle cramp. A diagnosis of Strain of gastrocnemius muscle of left lower extremity, initial encounter was also pertinent to this visit.   Ace wrap placed  Orders Placed This Encounter  Procedures  . Magnesium  . BASIC METABOLIC PANEL WITH GFR  . CK     No orders of the defined types were placed in this encounter.   There are no Patient Instructions on file for this visit.    Follow-up plan: Return if symptoms worsen or fail to improve.                                                 ################################################# ################################################# ################################################# #################################################    Current Meds  Medication Sig  . ALPRAZolam (XANAX) 0.5 MG tablet Take 1 tablet (0.5 mg total) by mouth 2 (two) times daily as needed for anxiety.  . Calcium Carbonate-Vitamin D 600-400 MG-UNIT chew tablet Chew 2 tablets by mouth daily.  Marland Kitchen escitalopram (LEXAPRO) 10 MG tablet Take 1 tablet (10 mg total) by mouth daily.   . fluconazole (DIFLUCAN) 150 MG tablet TAKE 1 TABLET(150 MG) BY MOUTH 1 TIME FOR 1 DOSE. REPEAT DOSE 72 HOURS IF YEAST INFECTION PERSISTS  . Galcanezumab-gnlm 120 MG/ML SOAJ Inject into the skin.  Marland Kitchen ibuprofen (ADVIL,MOTRIN) 800 MG tablet Take 1 tablet (800 mg total) by mouth 3 (three) times daily. For 5 - 7 days (can discontinue use if bleeding stops)  . medroxyPROGESTERone (DEPO-PROVERA) 150 MG/ML injection Inject into the muscle.  . Multiple Vitamins-Minerals (THERA-M) TABS Take by mouth.  . Multiple Vitamins-Minerals (WOMENS MULTI PO) Take by mouth.    No Known Allergies     Review of Systems:  Constitutional: No recent illness  HEENT: No  headache, no vision change  Cardiac: No  chest pain, No  pressure, No palpitations, no LE edema   Respiratory:  No  shortness of breath. No  Cough  Musculoskeletal: +new myalgia/arthralgia  Skin: No  Rash  Neurologic: No  weakness, No  Dizziness  Exam:  BP 118/85 (BP Location: Left Arm, Patient Position: Sitting, Cuff Size: Normal)   Pulse 77   Temp 98.7 F (37.1 C) (Oral)   Wt 186 lb 3.2 oz (84.5 kg)   BMI 35.18 kg/m   Constitutional: VS see above. General Appearance: alert, well-developed, well-nourished, NAD  Eyes: Normal lids and conjunctive, non-icteric sclera  Neck: No masses, trachea midline.   Respiratory: Normal respiratory effort.  Musculoskeletal: Gait normal. Symmetric and independent movement of all extremities. No knot/cord palpated in L calf area of pain. Neg Homan's on L   Neurological: Normal balance/coordination. No tremor.  Skin: warm, dry, intact.   Psychiatric: Normal judgment/insight. Normal mood and affect. Oriented x3.       Visit summary with medication list and pertinent instructions was printed for patient to review, patient was advised to alert us if any updates are needed. All questions at time of visit were answered - patient instructed to contact office with any additional concerns.  ER/RTC precautions were reviewed with the patient and understanding verbalized.     Please note: voice recognition software was used to produce this document, and typos may escape review. Please contact Dr. Lyn HollingsheadAlexander for any needed clarifications.    Follow up plan: Return if symptoms worsen or fail to improve.

## 2019-06-21 ENCOUNTER — Encounter: Payer: Self-pay | Admitting: Osteopathic Medicine

## 2019-06-21 LAB — BASIC METABOLIC PANEL WITH GFR
BUN: 8 mg/dL (ref 7–25)
CO2: 24 mmol/L (ref 20–32)
Calcium: 9.4 mg/dL (ref 8.6–10.2)
Chloride: 105 mmol/L (ref 98–110)
Creat: 0.59 mg/dL (ref 0.50–1.10)
GFR, Est African American: 135 mL/min/{1.73_m2} (ref >=60)
GFR, Est Non African American: 116 mL/min/{1.73_m2} (ref >=60)
Glucose, Bld: 82 mg/dL (ref 65–99)
Potassium: 4.4 mmol/L (ref 3.5–5.3)
Sodium: 137 mmol/L (ref 135–146)

## 2019-06-21 LAB — CK: Total CK: 73 U/L (ref 29–143)

## 2019-06-21 LAB — MAGNESIUM: Magnesium: 1.9 mg/dL (ref 1.5–2.5)

## 2019-06-25 ENCOUNTER — Encounter: Payer: Self-pay | Admitting: Osteopathic Medicine

## 2019-06-27 MED ORDER — GABAPENTIN 100 MG PO CAPS: 100.0000 mg | ORAL_CAPSULE | Freq: Three times a day (TID) | ORAL | 1 refills | Status: DC | PRN

## 2019-07-08 ENCOUNTER — Encounter: Payer: Self-pay | Admitting: Osteopathic Medicine

## 2019-08-15 ENCOUNTER — Encounter: Payer: Self-pay | Admitting: Osteopathic Medicine

## 2019-08-15 ENCOUNTER — Other Ambulatory Visit: Payer: Self-pay

## 2019-08-15 ENCOUNTER — Telehealth: Payer: Self-pay | Admitting: Osteopathic Medicine

## 2019-08-15 ENCOUNTER — Ambulatory Visit (INDEPENDENT_AMBULATORY_CARE_PROVIDER_SITE_OTHER): Payer: Managed Care, Other (non HMO) | Admitting: Osteopathic Medicine

## 2019-08-15 ENCOUNTER — Ambulatory Visit (INDEPENDENT_AMBULATORY_CARE_PROVIDER_SITE_OTHER): Payer: Managed Care, Other (non HMO)

## 2019-08-15 VITALS — BP 125/80 | HR 77 | Temp 98.6°F | Wt 189.9 lb

## 2019-08-15 DIAGNOSIS — M79609 Pain in unspecified limb: Secondary | ICD-10-CM

## 2019-08-15 DIAGNOSIS — R252 Cramp and spasm: Secondary | ICD-10-CM | POA: Diagnosis not present

## 2019-08-15 DIAGNOSIS — R29898 Other symptoms and signs involving the musculoskeletal system: Secondary | ICD-10-CM

## 2019-08-15 DIAGNOSIS — R296 Repeated falls: Secondary | ICD-10-CM

## 2019-08-15 DIAGNOSIS — R202 Paresthesia of skin: Secondary | ICD-10-CM

## 2019-08-15 MED ORDER — GABAPENTIN 300 MG PO CAPS: 300.0000 mg | ORAL_CAPSULE | Freq: Three times a day (TID) | ORAL | 1 refills | Status: DC | PRN
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
## 2019-08-15 NOTE — Patient Instructions (Signed)
Plan:  We will get some imaging of the leg and lumbar spine today with x-ray, will plan to get MRI of the lumbar spine, you should get a call about scheduling this once your insurance approves the test.  We will get you set up for a procedure called an ABI to evaluate circulation in the legs.  Can try increasing the gabapentin to 300 mg 3 times per day, and use the baclofen every evening before bed.  I do not think any additional blood work at this time is necessary.  Depending on results of tests, if we find anything going on that gives [name] a good answer, will pursue that.  If the tests are not really revealing anything, will send for more specialized testing with a neurologist.  Please let us know and/or seek emergency medical attention if needed if something gets worse or changes!

## 2019-08-15 NOTE — Telephone Encounter (Signed)
Authorization obtained for MRI lumbar spine w/o contrast. Auth number [id number]. KG LPN

## 2019-10-14 ENCOUNTER — Encounter: Payer: Self-pay | Admitting: Osteopathic Medicine

## 2019-10-29 ENCOUNTER — Other Ambulatory Visit: Payer: Self-pay

## 2019-10-29 MED ORDER — GABAPENTIN 300 MG PO CAPS: 300.0000 mg | ORAL_CAPSULE | Freq: Three times a day (TID) | ORAL | 1 refills | Status: DC | PRN

## 2019-11-20 ENCOUNTER — Telehealth: Payer: Self-pay

## 2019-11-20 NOTE — Telephone Encounter (Signed)
[facility] pharmacy is requesting med refill for fluconazole. Pls advise, thanks.

## 2019-11-25 MED ORDER — FLUCONAZOLE 150 MG PO TABS: ORAL_TABLET | ORAL | 1 refills | Status: DC

## 2019-11-25 NOTE — Telephone Encounter (Signed)
Noted. Pharmacy has contacted pt.

## 2019-11-25 NOTE — Telephone Encounter (Signed)
Sent t pharmacy on file

## 2020-01-03 ENCOUNTER — Other Ambulatory Visit: Payer: Self-pay

## 2020-01-03 ENCOUNTER — Ambulatory Visit: Payer: Managed Care, Other (non HMO)

## 2020-01-03 ENCOUNTER — Ambulatory Visit (INDEPENDENT_AMBULATORY_CARE_PROVIDER_SITE_OTHER): Payer: Managed Care, Other (non HMO) | Admitting: Sports Medicine

## 2020-01-03 VITALS — BP 120/82 | HR 92 | Wt 189.0 lb

## 2020-01-03 DIAGNOSIS — Z8742 Personal history of other diseases of the female genital tract: Secondary | ICD-10-CM | POA: Diagnosis not present

## 2020-01-03 DIAGNOSIS — Z3040 Encounter for surveillance of contraceptives, unspecified: Secondary | ICD-10-CM | POA: Diagnosis not present

## 2020-01-03 LAB — POCT URINE PREGNANCY: Preg Test, Ur: NEGATIVE

## 2020-01-03 MED ORDER — MEDROXYPROGESTERONE ACETATE 150 MG/ML IM SUSP
150.0000 mg | Freq: Once | INTRAMUSCULAR | Status: AC
  Administered 2020-01-03: 150 mg via INTRAMUSCULAR

## 2020-01-03 NOTE — Patient Instructions (Addendum)
Patient given instructions on when to come back to the office with PCP and next injection.

## 2020-01-03 NOTE — Progress Notes (Signed)
octPatient presents to clinic for re-starting depo provera  Injection. Patient last injection was in 05/2019. I completed a pregnancy test and patient reports that her tubes are tied. She takes the injection to help control heavy bleeding.   I completed a pregnancy test that was negative since it was greater than 14 weeks since the last injection.  I spoke with Dr. Karie Schwalbe and he states that it is ok to go ahead and start the Depo Provera injection. He reports that it will not hurt a baby if a person is pregnant.   Patient tolerated the injection well in her LUOQ with no immediate complications. She was given the paper with the next dates to schedule for her next shot. She was also advised to make a follow up with her PCP in the next month or 2 since its been a few months since she has seen the patient.

## 2020-03-23 ENCOUNTER — Ambulatory Visit: Payer: Managed Care, Other (non HMO)

## 2020-03-23 NOTE — Progress Notes (Deleted)
No showed for nurse visit.

## 2020-03-24 ENCOUNTER — Other Ambulatory Visit: Payer: Self-pay

## 2020-03-24 ENCOUNTER — Ambulatory Visit (INDEPENDENT_AMBULATORY_CARE_PROVIDER_SITE_OTHER): Payer: Managed Care, Other (non HMO) | Admitting: Osteopathic Medicine

## 2020-03-24 VITALS — BP 122/67 | HR 92

## 2020-03-24 DIAGNOSIS — Z3040 Encounter for surveillance of contraceptives, unspecified: Secondary | ICD-10-CM

## 2020-03-24 MED ORDER — MEDROXYPROGESTERONE ACETATE 150 MG/ML IM SUSP
150.0000 mg | Freq: Once | INTRAMUSCULAR | Status: AC
  Administered 2020-03-24: 16:00:00 150 mg via INTRAMUSCULAR

## 2020-03-24 NOTE — Progress Notes (Signed)
Judy Flores is here for a Depo Provera injection. Denies chest pain, shortness of breath, headaches, mood changes or problems with medication. Last injection 01/03/2020. Patient informed next injection due 06/09/2020-06/22/2020. Depo Provera injection to RUOQ with no apparent complications. Patient does mention she has a rash around right nipple, started a week ago, has been using A&D cream and getting better. Some itching, no pain. No drainage from nipple. No breast masses. I did advise she could try some hydrocortisone ointment OTC, but encouraged to contact us for an appointment with provider if it does not clear. She expressed understanding.

## 2020-05-08 ENCOUNTER — Telehealth: Payer: Self-pay

## 2020-05-08 NOTE — Telephone Encounter (Signed)
Non -urgent msg:  Pt called stating that she will be dropping off her FMLA paperwork. Per pt, current paperwork has expired. Pt informed of clinic protocol for completion of form. Pt voiced understanding. Aware she will be contacted once form is completed. No other inquiries during the call.

## 2020-06-11 ENCOUNTER — Ambulatory Visit (INDEPENDENT_AMBULATORY_CARE_PROVIDER_SITE_OTHER): Payer: 59 | Admitting: Osteopathic Medicine

## 2020-06-11 ENCOUNTER — Other Ambulatory Visit: Payer: Self-pay

## 2020-06-11 ENCOUNTER — Ambulatory Visit: Payer: 59

## 2020-06-11 VITALS — BP 126/76 | HR 76 | Ht 61.0 in | Wt 199.0 lb

## 2020-06-11 DIAGNOSIS — Z3040 Encounter for surveillance of contraceptives, unspecified: Secondary | ICD-10-CM

## 2020-06-11 MED ORDER — MEDROXYPROGESTERONE ACETATE 150 MG/ML IM SUSP
150.0000 mg | Freq: Once | INTRAMUSCULAR | Status: AC
  Administered 2020-06-11: 150 mg via INTRAMUSCULAR

## 2020-06-11 NOTE — Progress Notes (Signed)
Patient is here for Depo Provera injection. Denies chest pain, shortness of breath, headaches, mood changes, or problems with medication. Patient is on time for injection.   Injection administered in the LUOQ. Patient tolerated injection well without complications. Patient advised to schedule next injection in 12 weeks, between September 1-15, 2021 and to call with any issues.

## 2020-06-23 ENCOUNTER — Encounter: Payer: Self-pay | Admitting: Medical-Surgical

## 2020-06-23 ENCOUNTER — Other Ambulatory Visit (HOSPITAL_COMMUNITY)
Admission: RE | Admit: 2020-06-23 | Discharge: 2020-06-23 | Disposition: A | Discharge status: Home / Self Care | Payer: 59 | Source: Ambulatory Visit | Attending: Medical-Surgical | Admitting: Medical-Surgical

## 2020-06-23 ENCOUNTER — Ambulatory Visit (INDEPENDENT_AMBULATORY_CARE_PROVIDER_SITE_OTHER): Payer: 59 | Admitting: Medical-Surgical

## 2020-06-23 VITALS — BP 126/86 | HR 78 | Temp 98.8°F | Ht 61.0 in | Wt 201.1 lb

## 2020-06-23 DIAGNOSIS — N898 Other specified noninflammatory disorders of vagina: Secondary | ICD-10-CM

## 2020-06-23 DIAGNOSIS — Z124 Encounter for screening for malignant neoplasm of cervix: Secondary | ICD-10-CM

## 2020-06-23 DIAGNOSIS — Z8619 Personal history of other infectious and parasitic diseases: Secondary | ICD-10-CM

## 2020-06-23 LAB — WET PREP FOR TRICH, YEAST, CLUE
MICRO NUMBER:: 10646979
Specimen Quality: ADEQUATE

## 2020-06-23 MED ORDER — METRONIDAZOLE 500 MG PO TABS: 500.0000 mg | ORAL_TABLET | Freq: Two times a day (BID) | ORAL | 0 refills | Status: AC

## 2020-06-23 NOTE — Addendum Note (Signed)
Addended byChristen Butter on: 06/23/2020 03:51 PM   Modules accepted: Orders

## 2020-06-23 NOTE — Progress Notes (Signed)
.  Subjective:    CC: BV symptoms  HPI: Pleasant 39 year old female presenting today with complaints of 1 week of clear vaginal discharge that is sporadic. She notes that she has an intermittent fishy vaginal odor. She is not currently sexually active and reports her last sexual encounter was in may with a Female partner. She does not use douches and tries to avoid hot tubs/pools as they tend to cause BV for her. She notes that a week ago, she was at an event at a pool and decided to go swimming. She was hopeful that she wouldn't develop symptoms but she did. Denies dysuria, frequency, urgency, abnormal bleeding, vaginal itching/burning. No pelvic pain or vaginal lesions.  I reviewed the past medical history, family history, social history, surgical history, and allergies today and no changes were needed.  Please see the problem list section below in epic for further details.  Past Medical History: Past Medical History:  Diagnosis Date  . Abnormal Pap smear of cervix   . Migraine 01/09/2019  . Tension headache    Past Surgical History: Past Surgical History:  Procedure Laterality Date  . CRYOTHERAPY    . TUBAL LIGATION     Social History: Social History   Socioeconomic History  . Marital status: Single    Spouse name: Not on file  . Number of children: Not on file  . Years of education: Not on file  . Highest education level: Not on file  Occupational History  . Occupation: phlebotomist  Tobacco Use  . Smoking status: Never Smoker  . Smokeless tobacco: Never Used  Substance and Sexual Activity  . Alcohol use: No    Alcohol/week: 0.0 standard drinks  . Drug use: No  . Sexual activity: Yes    Partners: Male    Birth control/protection: Surgical  Other Topics Concern  . Not on file  Social History Narrative  . Not on file   Social Determinants of Health   Financial Resource Strain:   . Difficulty of Paying Living Expenses:   Food Insecurity:   . Worried About Community education officer in the Last Year:   . Barista in the Last Year:   Transportation Needs:   . Freight forwarder (Medical):   Marland Kitchen Lack of Transportation (Non-Medical):   Physical Activity:   . Days of Exercise per Week:   . Minutes of Exercise per Session:   Stress:   . Feeling of Stress :   Social Connections:   . Frequency of Communication with Friends and Family:   . Frequency of Social Gatherings with Friends and Family:   . Attends Religious Services:   . Active Member of Clubs or Organizations:   . Attends Banker Meetings:   Marland Kitchen Marital Status:    Family History: Family History  Problem Relation Age of Onset  . Hypertension Maternal Grandmother   . Diabetes Maternal Grandmother   . Breast cancer Paternal Grandmother    Allergies: No Known Allergies Medications: See med rec.  Review of Systems: See HPI for pertinent positives and negatives.   Objective:    General: Well Developed, well nourished, and in no acute distress.  Neuro: Alert and oriented x3.  HEENT: Normocephalic, atraumatic.  Skin: Warm and dry. Cardiac: Regular rate and rhythm, no murmurs rubs or gallops, no lower extremity edema.  Respiratory: Clear to auscultation bilaterally. Not using accessory muscles, speaking in full sentences. Pelvic exam: VULVA: normal appearing vulva with no masses, tenderness  or lesions, VAGINA: normal appearing vagina with normal color and discharge, no lesions, CERVIX: cervical discharge present - mucoid, DNA probe for chlamydia and GC obtained, cervical motion tenderness absent, PAP: Pap smear done today, HPV test, WET MOUNT done - results: Pending lab review. Chaperoned by Ihor Gully, MA.  Impression and Recommendations:    1. Vaginal discharge Wet prep completed for evaluation for BV. History of BV but we will wait for the results to come back before starting treatment. - WET PREP FOR TRICH, YEAST, CLUE  2. Screening for cervical cancer Pap smear  with HPV cotesting and gonorrhea/chlamydia done today. Last pap in 01/2019 + for HPV, not rechecked at the 1 year mark. - Cytology - PAP  Return if symptoms worsen or fail to improve. ___________________________________________ Thayer Ohm, DNP, APRN, FNP-BC Primary Care and Sports Medicine [facility] [place]

## 2020-06-25 LAB — CYTOLOGY - PAP
Chlamydia: NEGATIVE
Comment: NEGATIVE
Comment: NEGATIVE
Comment: NORMAL
Diagnosis: NEGATIVE
High risk HPV: POSITIVE — AB
Neisseria Gonorrhea: NEGATIVE

## 2020-06-26 NOTE — Addendum Note (Signed)
Addended byChristen Butter on: 06/26/2020 11:16 AM   Modules accepted: Orders

## 2020-08-10 DIAGNOSIS — F320 Major depressive disorder, single episode, mild: Secondary | ICD-10-CM | POA: Insufficient documentation

## 2020-08-10 DIAGNOSIS — G43009 Migraine without aura, not intractable, without status migrainosus: Secondary | ICD-10-CM | POA: Insufficient documentation

## 2020-08-17 ENCOUNTER — Other Ambulatory Visit: Payer: Self-pay | Admitting: Osteopathic Medicine

## 2020-08-26 ENCOUNTER — Ambulatory Visit: Payer: 59

## 2020-08-28 ENCOUNTER — Ambulatory Visit (INDEPENDENT_AMBULATORY_CARE_PROVIDER_SITE_OTHER): Payer: 59 | Admitting: Family Medicine

## 2020-08-28 VITALS — BP 118/90 | HR 87 | Temp 98.0°F | Ht 61.0 in | Wt 200.0 lb

## 2020-08-28 DIAGNOSIS — Z3040 Encounter for surveillance of contraceptives, unspecified: Secondary | ICD-10-CM | POA: Diagnosis not present

## 2020-08-28 MED ORDER — MEDROXYPROGESTERONE ACETATE 150 MG/ML IM SUSP
150.0000 mg | Freq: Once | INTRAMUSCULAR | Status: AC
  Administered 2020-08-28: 150 mg via INTRAMUSCULAR

## 2020-08-28 NOTE — Progress Notes (Signed)
Patient is here for Depo Provera injection. Denies chest pain, shortness of breath, headaches, mood changes, or problems with medication. Patient is on time for injection.   Injection administered in the RUOQ. Patient tolerated injection well without complications. Patient advised to schedule next injection in 12 weeks, between November 26-December 3rd. Call the office with any issues or concerns.

## 2020-08-28 NOTE — Progress Notes (Signed)
Medical screening examination/treatment was performed by qualified clinical staff member and as supervising physician I was immediately available for consultation/collaboration. I have reviewed documentation and agree with assessment and plan.  Deziree Mokry, DO  

## 2020-10-12 ENCOUNTER — Telehealth: Payer: Self-pay | Admitting: Osteopathic Medicine

## 2020-10-12 NOTE — Telephone Encounter (Signed)
Per pt MYChart [id number] send updated 90 day prescription refills for all meds. [employer] is making me do a mandatory switch [facility] [address], [place], [place] [address]. Thank you

## 2020-10-13 NOTE — Telephone Encounter (Signed)
Patient needs yearly physical with PCP before we can send 90 day refills to mail order. Please call patient and get this scheduled.

## 2020-10-14 NOTE — Telephone Encounter (Signed)
Left v/m with instructions for pt

## 2020-10-27 ENCOUNTER — Encounter: Payer: Self-pay | Admitting: Osteopathic Medicine

## 2020-11-09 ENCOUNTER — Telehealth: Payer: Self-pay

## 2020-11-09 ENCOUNTER — Ambulatory Visit (INDEPENDENT_AMBULATORY_CARE_PROVIDER_SITE_OTHER): Payer: 59 | Admitting: Sports Medicine

## 2020-11-09 DIAGNOSIS — M5416 Radiculopathy, lumbar region: Secondary | ICD-10-CM | POA: Diagnosis not present

## 2020-11-09 MED ORDER — CYCLOBENZAPRINE HCL 10 MG PO TABS: ORAL_TABLET | ORAL | 0 refills | Status: AC

## 2020-11-09 MED ORDER — PREDNISONE 50 MG PO TABS: ORAL_TABLET | ORAL | 0 refills | Status: DC
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
## 2020-11-09 NOTE — Telephone Encounter (Signed)
Will do, thanks

## 2020-11-09 NOTE — Telephone Encounter (Signed)
Put FMLA papers in Dr Mardelle Matte in-basket at her station. Patient states this is for the same as last time she did FMLA papers and just need the dates updated. Thank you
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
## 2020-11-09 NOTE — Telephone Encounter (Signed)
Pt called requesting whether provider has completed her updated FMLA form? Pt is also requesting to do Imaging. Per pt, she pulled a muscle in her back, currently in pain and unable to move. Pt does not want to make an appt to be evaluated for the back pain. Pls advise, thanks.

## 2020-11-10 DIAGNOSIS — M5416 Radiculopathy, lumbar region: Secondary | ICD-10-CM | POA: Diagnosis not present

## 2020-11-10 MED ORDER — KETOROLAC TROMETHAMINE 30 MG/ML IJ SOLN
30.0000 mg | Freq: Once | INTRAMUSCULAR | Status: AC
  Administered 2020-11-10: 30 mg via INTRAMUSCULAR

## 2020-11-10 NOTE — Addendum Note (Signed)
Addended by: Carolin Coy on: 11/10/2020 08:15 AM   Modules accepted: Orders

## 2020-11-13 ENCOUNTER — Ambulatory Visit: Payer: 59

## 2020-11-13 NOTE — Telephone Encounter (Signed)
Task completed. Form faxed to [phone]. Confirmation rec'd. Copy of form placed in scan folder.

## 2020-11-23 ENCOUNTER — Telehealth: Payer: Self-pay

## 2020-11-23 ENCOUNTER — Ambulatory Visit: Payer: 59

## 2020-11-23 NOTE — Telephone Encounter (Signed)
Patient has no-showed her last 2 nurse visits for depo inj.   Patient has not seen her PCP in >1 year.   Patient must have depo done by December 3rd to remain in compliance window. When she calls to reschedule, she must also schedule yearly with her PCP.    If there is opening on nurse visit schedule that she can make this week, she is OK to be scheduled there for depo and then schedule appt with PCP later (within the next 30 days). If patient is not able to make it in for nurse visit until after December 3rd, appt has to be with provider for physical and to restart process since she will be out of her window.    If any questions, please feel free to come ask. Thanks!

## 2020-11-23 NOTE — Telephone Encounter (Signed)
See message.

## 2020-11-24 ENCOUNTER — Other Ambulatory Visit: Payer: Self-pay

## 2020-11-24 ENCOUNTER — Ambulatory Visit (INDEPENDENT_AMBULATORY_CARE_PROVIDER_SITE_OTHER): Payer: 59 | Admitting: Osteopathic Medicine

## 2020-11-24 VITALS — BP 138/81 | HR 114

## 2020-11-24 DIAGNOSIS — Z3040 Encounter for surveillance of contraceptives, unspecified: Secondary | ICD-10-CM | POA: Diagnosis not present

## 2020-11-24 MED ORDER — MEDROXYPROGESTERONE ACETATE 150 MG/ML IM SUSP
150.0000 mg | Freq: Once | INTRAMUSCULAR | Status: AC
  Administered 2020-11-24: 150 mg via INTRAMUSCULAR

## 2020-11-24 NOTE — Progress Notes (Signed)
Established Patient Office Visit  Subjective:  Patient ID: Judy Flores, female    DOB: [birth year]  Age: 39 y.o. MRN: [id number]  CC:  Chief Complaint  Patient presents with  . Contraception    HPI Judy Flores is here for a Depo Provera injection. Denies chest pain, shortness of breath, headaches, mood changes or problems with medication. It has been < 14 weeks since her last Depo Provera injection.   Past Medical History:  Diagnosis Date  . Abnormal Pap smear of cervix   . Migraine 01/09/2019  . Tension headache     Past Surgical History:  Procedure Laterality Date  . CRYOTHERAPY    . TUBAL LIGATION      Family History  Problem Relation Age of Onset  . Hypertension Maternal Grandmother   . Diabetes Maternal Grandmother   . Breast cancer Paternal Grandmother     Social History   Socioeconomic History  . Marital status: Single    Spouse name: Not on file  . Number of children: Not on file  . Years of education: Not on file  . Highest education level: Not on file  Occupational History  . Occupation: phlebotomist  Tobacco Use  . Smoking status: Never Smoker  . Smokeless tobacco: Never Used  Substance and Sexual Activity  . Alcohol use: No    Alcohol/week: 0.0 standard drinks  . Drug use: No  . Sexual activity: Yes    Partners: Male    Birth control/protection: Surgical  Other Topics Concern  . Not on file  Social History Narrative  . Not on file   Social Determinants of Health   Financial Resource Strain:   . Difficulty of Paying Living Expenses: Not on file  Food Insecurity:   . Worried About Programme researcher, broadcasting/film/video in the Last Year: Not on file  . Ran Out of Food in the Last Year: Not on file  Transportation Needs:   . Lack of Transportation (Medical): Not on file  . Lack of Transportation (Non-Medical): Not on file  Physical Activity:   . Days of Exercise per Week: Not on file  . Minutes of Exercise per Session: Not on file  Stress:   . Feeling  of Stress : Not on file  Social Connections:   . Frequency of Communication with Friends and Family: Not on file  . Frequency of Social Gatherings with Friends and Family: Not on file  . Attends Religious Services: Not on file  . Active Member of Clubs or Organizations: Not on file  . Attends Banker Meetings: Not on file  . Marital Status: Not on file  Intimate Partner Violence:   . Fear of Current or Ex-Partner: Not on file  . Emotionally Abused: Not on file  . Physically Abused: Not on file  . Sexually Abused: Not on file    Outpatient Medications Prior to Visit  Medication Sig Dispense Refill  . ALPRAZolam (XANAX) 0.5 MG tablet Take 1 tablet (0.5 mg total) by mouth 2 (two) times daily as needed for anxiety. 15 tablet 0  . baclofen (LIORESAL) 10 MG tablet Take 1 tablet by mouth daily as needed.    . Calcium Carbonate-Vitamin D 600-400 MG-UNIT chew tablet Chew 2 tablets by mouth daily. 180 tablet 3  . cyclobenzaprine (FLEXERIL) 10 MG tablet One half to one tab PO qHS, then increase gradually to one tab TID. 30 tablet 0  . EMGALITY 120 MG/ML SOAJ Inject 1 mL into the skin every 28 (  twenty-eight) days.    Marland Kitchen escitalopram (LEXAPRO) 10 MG tablet Take 1 tablet (10 mg total) by mouth daily. 90 tablet 1  . fluconazole (DIFLUCAN) 150 MG tablet Take 150 mg by mouth once.    . gabapentin (NEURONTIN) 300 MG capsule Take 1-2 capsules (300-600 mg total) by mouth 3 (three) times daily as needed (cramps). 180 capsule 1  . ibuprofen (ADVIL,MOTRIN) 800 MG tablet Take 1 tablet (800 mg total) by mouth 3 (three) times daily. For 5 - 7 days (can discontinue use if bleeding stops) 30 tablet 0  . medroxyPROGESTERone (DEPO-PROVERA) 150 MG/ML injection Inject into the muscle.    . Multiple Vitamins-Minerals (THERA-M) TABS Take by mouth.    . naproxen (NAPROSYN) 500 MG tablet Take 500 mg by mouth 2 (two) times daily.    . predniSONE (DELTASONE) 50 MG tablet One tab PO daily for 5 days. 5 tablet 0   . rizatriptan (MAXALT) 10 MG tablet Take 1 tablet by mouth daily as needed.     No facility-administered medications prior to visit.    No Known Allergies  ROS Review of Systems    Objective:    Physical Exam  BP 138/81   Pulse (!) 114   SpO2 100%  Wt Readings from Last 3 Encounters:  08/28/20 200 lb (90.7 kg)  06/23/20 201 lb 1.6 oz (91.2 kg)  06/11/20 199 lb (90.3 kg)     Health Maintenance Due  Topic Date Due  . COVID-19 Vaccine (1) Never done  . INFLUENZA VACCINE  Never done    There are no preventive care reminders to display for this patient.  No results found for: TSH Lab Results  Component Value Date   WBC 7.2 08/08/2016   HGB 11.2 (L) 08/08/2016   HCT 35.6 08/08/2016   MCV 85.4 08/08/2016   PLT 367 08/08/2016   Lab Results  Component Value Date   NA 137 06/20/2019   K 4.4 06/20/2019   CO2 24 06/20/2019   GLUCOSE 82 06/20/2019   BUN 8 06/20/2019   CREATININE 0.59 06/20/2019   BILITOT 0.4 01/28/2016   ALKPHOS 54 01/28/2016   AST 14 01/28/2016   ALT 10 01/28/2016   PROT 7.3 01/28/2016   ALBUMIN 4.4 01/28/2016   CALCIUM 9.4 06/20/2019   Lab Results  Component Value Date   CHOL 159 01/28/2016   Lab Results  Component Value Date   HDL 56 01/28/2016   Lab Results  Component Value Date   LDLCALC 95 01/28/2016   Lab Results  Component Value Date   TRIG 41 01/28/2016   Lab Results  Component Value Date   CHOLHDL 2.8 01/28/2016   No results found for: HGBA1C    Assessment & Plan:  Contraceptive - Patient tolerated injection well without complications. Patient advised to schedule next injection 12 weeks  from today. Between February 14 th through February 28 th.    Problem List Items Addressed This Visit    None    Visit Diagnoses    Encounter for surveillance of contraceptives, unspecified contraceptive    -  Primary   Relevant Medications   medroxyPROGESTERone (DEPO-PROVERA) injection 150 mg (Completed) (Start on 11/24/2020  11:00 AM)      Meds ordered this encounter  Medications  . medroxyPROGESTERone (DEPO-PROVERA) injection 150 mg    Follow-up: Return in about 12 weeks (around 02/16/2021) for Depo-Provera .    Esmond Harps, CMA

## 2020-11-25 ENCOUNTER — Ambulatory Visit: Payer: 59

## 2021-01-23 IMAGING — DX LEFT FEMUR 2 VIEWS
4 series · 4 of 4 positions shown · non-contrast
Comparison: None.

CLINICAL DATA: 38-year-old female with left lower extremity pain.

EXAM:
LEFT FEMUR 2 VIEWS; LEFT TIBIA AND FIBULA - 2 VIEW

[femur ap (1 of 2)]
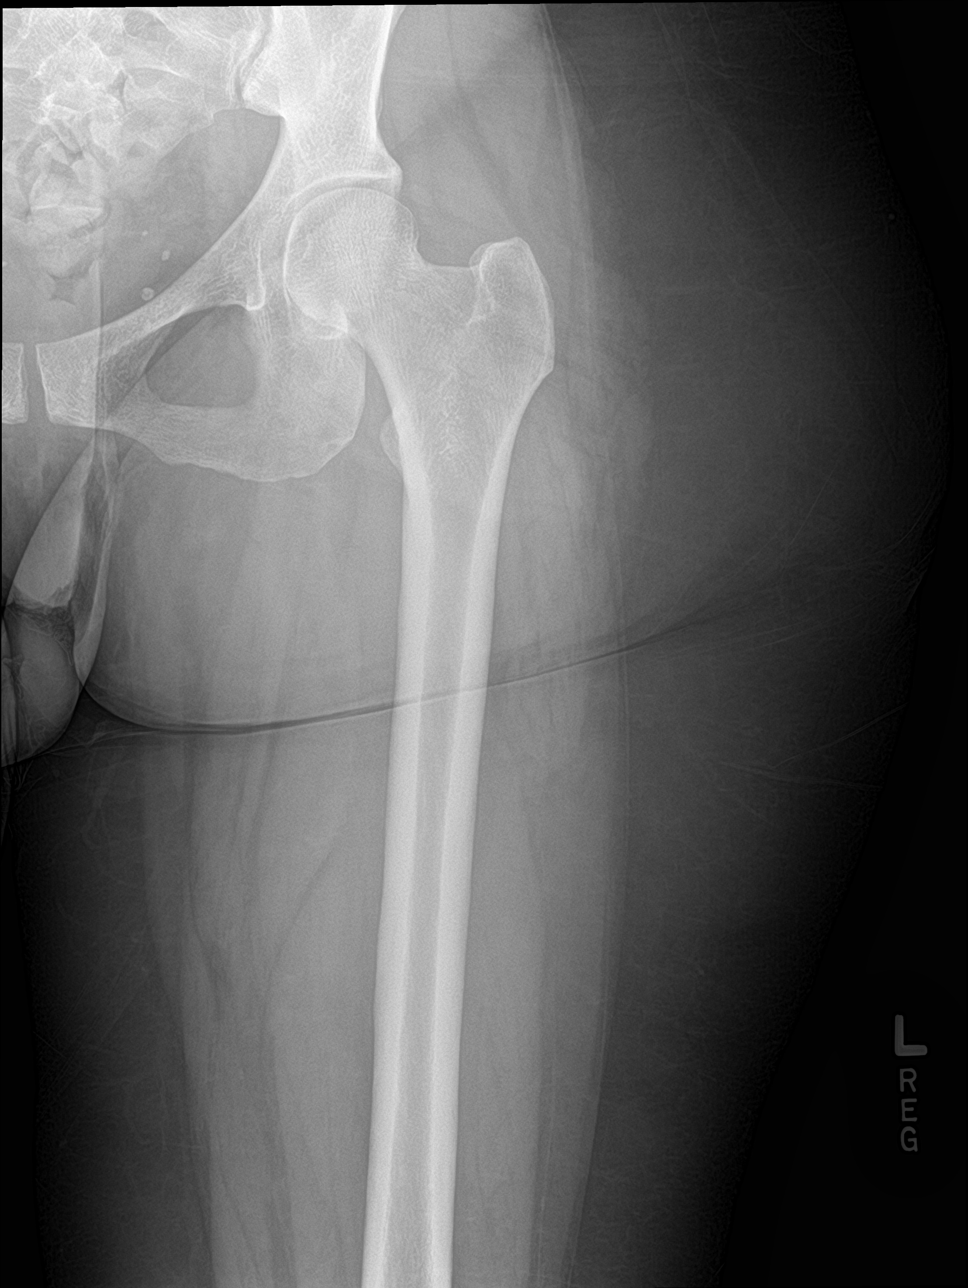

[femur ap (2 of 2)]
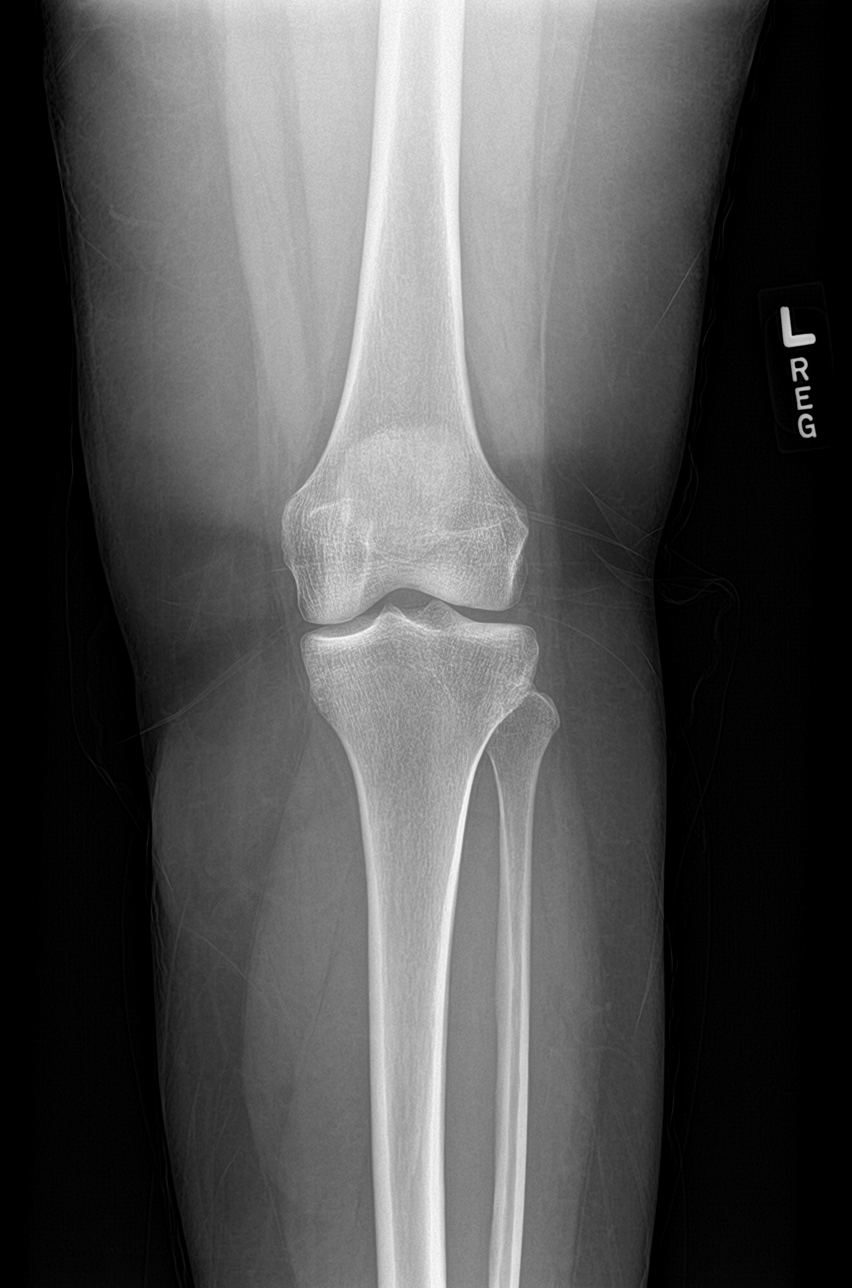

[femur lat (1 of 2)]
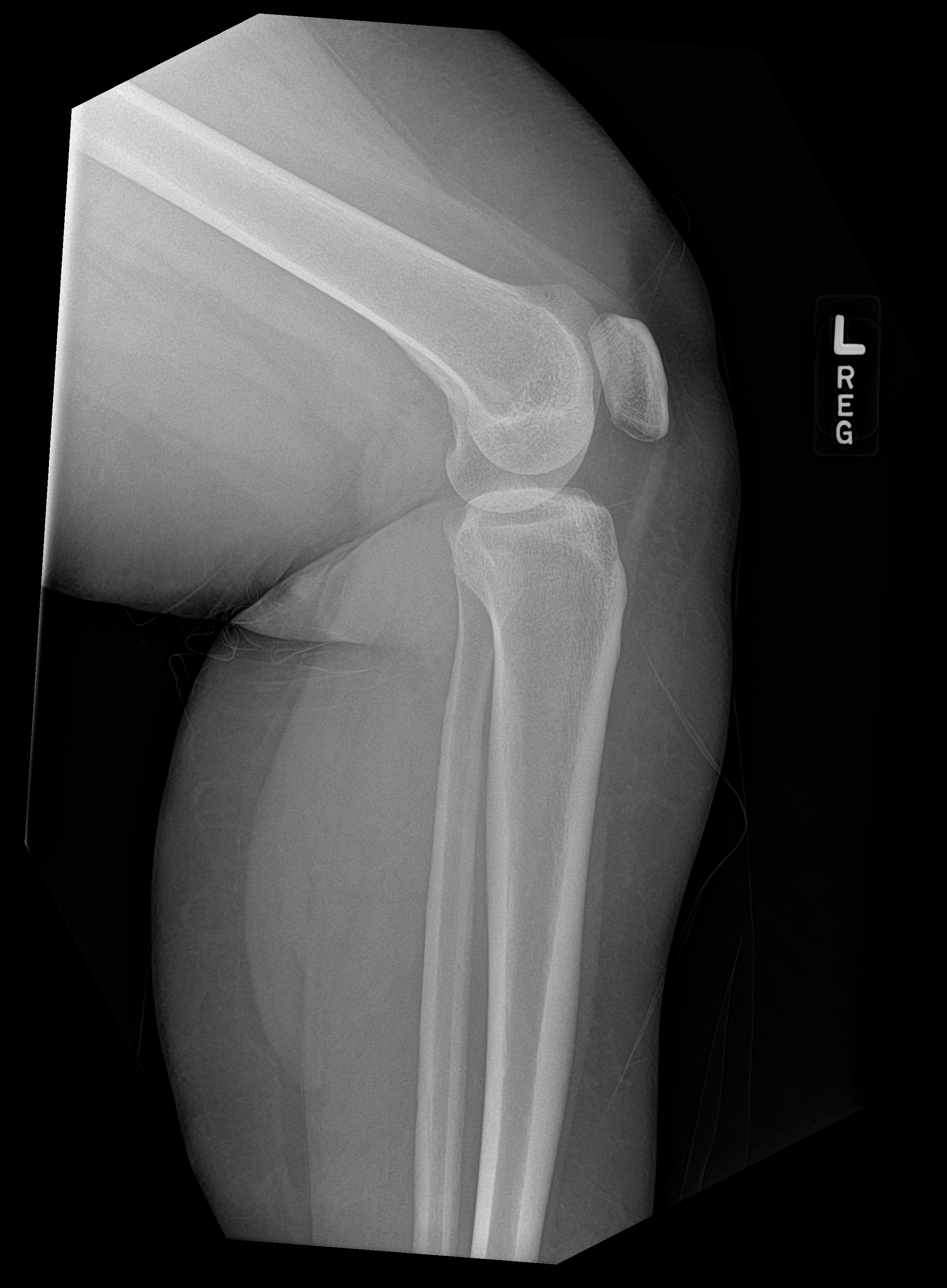

[femur lat (2 of 2)]
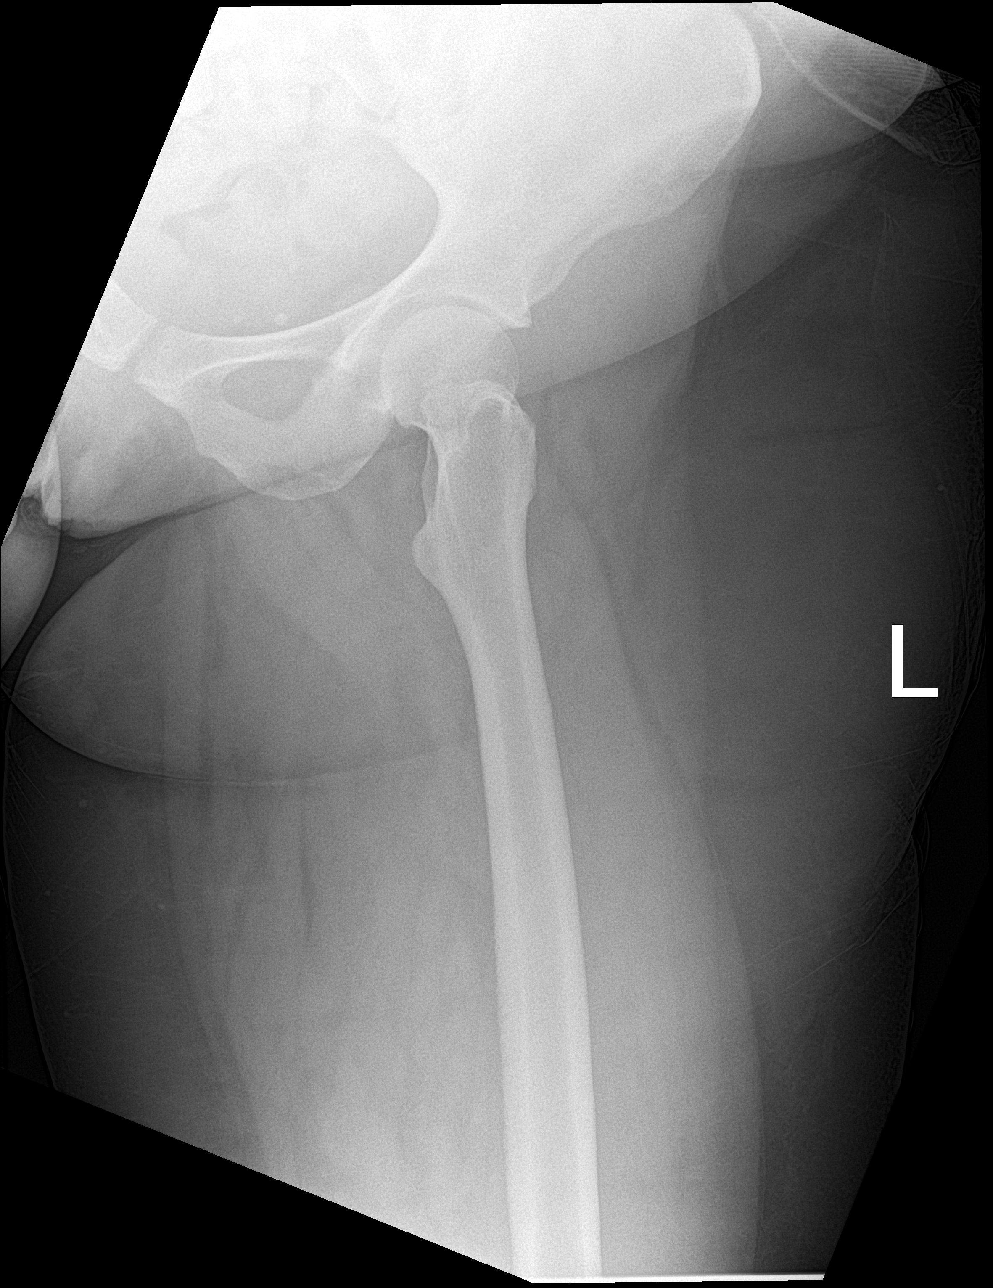

[4 of 4 positions shown; findings below may reference images not displayed]

FINDINGS: There is no evidence of fracture or other focal bone lesions. Soft
tissues are unremarkable.
IMPRESSION: Negative.

## 2021-02-09 ENCOUNTER — Ambulatory Visit: Payer: 59

## 2021-03-05 ENCOUNTER — Ambulatory Visit (INDEPENDENT_AMBULATORY_CARE_PROVIDER_SITE_OTHER): Payer: 59 | Admitting: Osteopathic Medicine

## 2021-03-05 ENCOUNTER — Other Ambulatory Visit: Payer: Self-pay

## 2021-03-05 VITALS — BP 128/80 | HR 82 | Temp 98.0°F | Resp 20 | Ht 61.0 in | Wt 200.0 lb

## 2021-03-05 DIAGNOSIS — Z3040 Encounter for surveillance of contraceptives, unspecified: Secondary | ICD-10-CM

## 2021-03-05 MED ORDER — MEDROXYPROGESTERONE ACETATE 150 MG/ML IM SUSP
150.0000 mg | Freq: Once | INTRAMUSCULAR | Status: AC
  Administered 2021-03-05: 150 mg via INTRAMUSCULAR

## 2021-03-05 NOTE — Progress Notes (Signed)
Established Patient Office Visit  Subjective:  Patient ID: Judy Flores, female    DOB: [birth year]  Age: 40 y.o. MRN: [id number]  CC:  Chief Complaint  Patient presents with  . Contraception    HPI Judy Flores presents for a Depo Provera injection. Denies chest pain, shortness of breath, headaches, mood changes or problems with medication.  Past Medical History:  Diagnosis Date  . Abnormal Pap smear of cervix   . Migraine 01/09/2019  . Tension headache     Past Surgical History:  Procedure Laterality Date  . CRYOTHERAPY    . TUBAL LIGATION      Family History  Problem Relation Age of Onset  . Hypertension Maternal Grandmother   . Diabetes Maternal Grandmother   . Breast cancer Paternal Grandmother     Social History   Socioeconomic History  . Marital status: Single    Spouse name: Not on file  . Number of children: Not on file  . Years of education: Not on file  . Highest education level: Not on file  Occupational History  . Occupation: phlebotomist  Tobacco Use  . Smoking status: Never Smoker  . Smokeless tobacco: Never Used  Substance and Sexual Activity  . Alcohol use: No    Alcohol/week: 0.0 standard drinks  . Drug use: No  . Sexual activity: Yes    Partners: Male    Birth control/protection: Surgical  Other Topics Concern  . Not on file  Social History Narrative  . Not on file   Social Determinants of Health   Financial Resource Strain: Not on file  Food Insecurity: Not on file  Transportation Needs: Not on file  Physical Activity: Not on file  Stress: Not on file  Social Connections: Not on file  Intimate Partner Violence: Not on file    Outpatient Medications Prior to Visit  Medication Sig Dispense Refill  . ALPRAZolam (XANAX) 0.5 MG tablet Take 1 tablet (0.5 mg total) by mouth 2 (two) times daily as needed for anxiety. 15 tablet 0  . baclofen (LIORESAL) 10 MG tablet Take 1 tablet by mouth daily as needed.    . Calcium  Carbonate-Vitamin D 600-400 MG-UNIT chew tablet Chew 2 tablets by mouth daily. 180 tablet 3  . cyclobenzaprine (FLEXERIL) 10 MG tablet One half to one tab PO qHS, then increase gradually to one tab TID. 30 tablet 0  . EMGALITY 120 MG/ML SOAJ Inject 1 mL into the skin every 28 (twenty-eight) days.    Marland Kitchen escitalopram (LEXAPRO) 10 MG tablet Take 1 tablet (10 mg total) by mouth daily. 90 tablet 1  . fluconazole (DIFLUCAN) 150 MG tablet Take 150 mg by mouth once.    . gabapentin (NEURONTIN) 300 MG capsule Take 1-2 capsules (300-600 mg total) by mouth 3 (three) times daily as needed (cramps). 180 capsule 1  . ibuprofen (ADVIL,MOTRIN) 800 MG tablet Take 1 tablet (800 mg total) by mouth 3 (three) times daily. For 5 - 7 days (can discontinue use if bleeding stops) 30 tablet 0  . medroxyPROGESTERone (DEPO-PROVERA) 150 MG/ML injection Inject into the muscle.    . Multiple Vitamins-Minerals (THERA-M) TABS Take by mouth.    . naproxen (NAPROSYN) 500 MG tablet Take 500 mg by mouth 2 (two) times daily.    . predniSONE (DELTASONE) 50 MG tablet One tab PO daily for 5 days. 5 tablet 0  . rizatriptan (MAXALT) 10 MG tablet Take 1 tablet by mouth daily as needed.     No facility-administered medications  prior to visit.    No Known Allergies  ROS Review of Systems    Objective:    Physical Exam  BP 128/80 (BP Location: Left Arm, Patient Position: Sitting, Cuff Size: Normal)   Pulse 82   Temp 98 F (36.7 C) (Oral)   Resp 20   Ht 5\' 1"  (1.549 m)   Wt 200 lb (90.7 kg)   SpO2 98%   BMI 37.79 kg/m  Wt Readings from Last 3 Encounters:  03/05/21 200 lb (90.7 kg)  08/28/20 200 lb (90.7 kg)  06/23/20 201 lb 1.6 oz (91.2 kg)     Health Maintenance Due  Topic Date Due  . COVID-19 Vaccine (1) Never done  . INFLUENZA VACCINE  Never done    There are no preventive care reminders to display for this patient.  No results found for: TSH Lab Results  Component Value Date   WBC 7.2 08/08/2016   HGB 11.2  (L) 08/08/2016   HCT 35.6 08/08/2016   MCV 85.4 08/08/2016   PLT 367 08/08/2016   Lab Results  Component Value Date   NA 137 06/20/2019   K 4.4 06/20/2019   CO2 24 06/20/2019   GLUCOSE 82 06/20/2019   BUN 8 06/20/2019   CREATININE 0.59 06/20/2019   BILITOT 0.4 01/28/2016   ALKPHOS 54 01/28/2016   AST 14 01/28/2016   ALT 10 01/28/2016   PROT 7.3 01/28/2016   ALBUMIN 4.4 01/28/2016   CALCIUM 9.4 06/20/2019   Lab Results  Component Value Date   CHOL 159 01/28/2016   Lab Results  Component Value Date   HDL 56 01/28/2016   Lab Results  Component Value Date   LDLCALC 95 01/28/2016   Lab Results  Component Value Date   TRIG 41 01/28/2016   Lab Results  Component Value Date   CHOLHDL 2.8 01/28/2016   No results found for: HGBA1C    Assessment & Plan:  Depo injection given in [facility] without complications. Pt tolerated well, will return in 12 weeks for next injection. Problem List Items Addressed This Visit   None   Visit Diagnoses    Encounter for surveillance of contraceptives, unspecified contraceptive    -  Primary   Relevant Medications   medroxyPROGESTERone (DEPO-PROVERA) injection 150 mg (Completed)      Meds ordered this encounter  Medications  . medroxyPROGESTERone (DEPO-PROVERA) injection 150 mg    Follow-up: Return in about 12 weeks (around 05/28/2021) for Depo Injection.    07/28/2021, CMA

## 2021-03-23 ENCOUNTER — Encounter: Payer: Self-pay | Admitting: Osteopathic Medicine

## 2021-03-23 ENCOUNTER — Ambulatory Visit (INDEPENDENT_AMBULATORY_CARE_PROVIDER_SITE_OTHER): Payer: 59 | Admitting: Osteopathic Medicine

## 2021-03-23 ENCOUNTER — Other Ambulatory Visit: Payer: Self-pay

## 2021-03-23 VITALS — BP 129/83 | HR 85 | Temp 98.3°F | Wt 204.0 lb

## 2021-03-23 DIAGNOSIS — E65 Localized adiposity: Secondary | ICD-10-CM | POA: Diagnosis not present

## 2021-03-23 DIAGNOSIS — R609 Edema, unspecified: Secondary | ICD-10-CM | POA: Diagnosis not present

## 2021-03-23 NOTE — Patient Instructions (Signed)
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 I've placed referral to plastic surgery to discuss options for treatment. Would also recommend consistently wearing compression socks 20-30 mmHg pressure rating.

## 2021-03-23 NOTE — Progress Notes (Signed)
Judy Flores is a 40 y.o. female who presents to  [facility] at [facility]  today, 03/23/21, seeking care for the following:  Concern for lumpiness in lower extremities bilaterally, no pain, no pitting, no skin discoloration other than bruising more easily.      ASSESSMENT & PLAN with other pertinent findings:  The primary encounter diagnosis was Localized adiposity. 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 I've placed referral to plastic surgery to discuss options for treatment. Would also recommend consistently wearing compression socks 20-30 mmHg pressure rating.    Orders Placed This Encounter  Procedures  . Ambulatory referral to Plastic Surgery    No orders of the defined types were placed in this encounter.    See below for relevant physical exam findings  See below for recent lab and imaging results reviewed  Medications, allergies, PMH, PSH, SocH, FamH reviewed below    Follow-up instructions: Return if symptoms worsen or fail to improve.                                        Exam:  BP 129/83 (BP Location: Left Arm, Patient Position: Sitting, Cuff Size: Normal)   Pulse 85   Temp 98.3 F (36.8 C) (Oral)   Wt 204 lb (92.5 kg)   BMI 38.55 kg/m   Constitutional: VS see above. General Appearance: alert, well-developed, well-nourished, NAD  Neck: No masses, trachea midline.   Respiratory: Normal respiratory effort. no wheeze, no rhonchi, no rales  Cardiovascular: S1/S2 normal, no murmur, no rub/gallop auscultated. RRR.  No lower extremity edema, discoloration, temperature difference  Musculoskeletal: Gait normal. Symmetric and independent movement of all extremities.    Adipose deposition below knees bilaterally, symmetric, adipose  deposition around ankles, symmetric.  Abdominal: non-tender, non-distended, no appreciable organomegaly, neg Murphy's, BS [id number]  Neurological: Normal balance/coordination. No tremor.  Skin: warm, dry, intact.   Psychiatric: Normal judgment/insight. Normal mood and affect. Oriented x3.   Current Meds  Medication Sig  . ALPRAZolam (XANAX) 0.5 MG tablet Take 1 tablet (0.5 mg total) by mouth 2 (two) times daily as needed for anxiety.  . baclofen (LIORESAL) 10 MG tablet Take 1 tablet by mouth daily as needed.  . Calcium Carbonate-Vitamin D 600-400 MG-UNIT chew tablet Chew 2 tablets by mouth daily.  . cyclobenzaprine (FLEXERIL) 10 MG tablet One half to one tab PO qHS, then increase gradually to one tab TID.  Marland Kitchen EMGALITY 120 MG/ML SOAJ Inject 1 mL into the skin every 28 (twenty-eight) days.  Marland Kitchen escitalopram (LEXAPRO) 10 MG tablet Take 1 tablet (10 mg total) by mouth daily.  . fluconazole (DIFLUCAN) 150 MG tablet Take 150 mg by mouth once.  . gabapentin (NEURONTIN) 300 MG capsule Take 1-2 capsules (300-600 mg total) by mouth 3 (three) times daily as needed (cramps).  Marland Kitchen ibuprofen (ADVIL,MOTRIN) 800 MG tablet Take 1 tablet (800 mg total) by mouth 3 (three) times daily. For 5 - 7 days (can discontinue use if bleeding stops)  . medroxyPROGESTERone (DEPO-PROVERA) 150 MG/ML injection Inject into the muscle.  . Multiple Vitamins-Minerals (THERA-M) TABS Take by mouth.  . naproxen (NAPROSYN) 500 MG tablet Take 500 mg by mouth 2 (two) times daily.  . predniSONE (DELTASONE) 50 MG tablet One  tab PO daily for 5 days.  . rizatriptan (MAXALT) 10 MG tablet Take 1 tablet by mouth daily as needed.    No Known Allergies  Patient Active Problem List   Diagnosis Date Noted  . Left lumbar radiculopathy 11/09/2020  . Muscle cramp 06/19/2019  . Migraine 01/09/2019  . Lymphadenopathy, posterior cervical 03/12/2018  . Ganglion cyst of both wrists 05/17/2017  . Encounter for prescription for depo-Provera 03/09/2017   . Abnormal uterine bleeding 03/09/2017  . Bacterial vaginosis 03/09/2017  . Screen for STD (sexually transmitted disease) 03/09/2017  . Tension headache 12/31/2015    Family History  Problem Relation Age of Onset  . Hypertension Maternal Grandmother   . Diabetes Maternal Grandmother   . Breast cancer Paternal Grandmother     Social History   Tobacco Use  Smoking Status Never Smoker  Smokeless Tobacco Never Used    Past Surgical History:  Procedure Laterality Date  . CRYOTHERAPY    . TUBAL LIGATION      Immunization History  Administered Date(s) Administered  . Tdap 12/26/2014    No results found for this or any previous visit (from the past 2160 hour(s)).  No results found.     All questions at time of visit were answered - patient instructed to contact office with any additional concerns or updates. ER/RTC precautions were reviewed with the patient as applicable.   Please note: manual typing as well as voice recognition software may have been used to produce this document - typos may escape review. Please contact Dr. Lyn Hollingshead for any needed clarifications.

## 2021-05-12 ENCOUNTER — Ambulatory Visit (INDEPENDENT_AMBULATORY_CARE_PROVIDER_SITE_OTHER): Payer: 59 | Admitting: Plastic Surgery

## 2021-05-12 ENCOUNTER — Encounter: Payer: Self-pay | Admitting: Plastic Surgery

## 2021-05-12 ENCOUNTER — Other Ambulatory Visit: Payer: Self-pay

## 2021-05-12 VITALS — BP 135/87 | HR 85 | Ht 61.0 in | Wt 204.2 lb

## 2021-05-12 DIAGNOSIS — M7989 Other specified soft tissue disorders: Secondary | ICD-10-CM

## 2021-05-12 NOTE — Progress Notes (Signed)
Referring Provider Sunnie Nielsen, DO [address] [place],  [place] [address]   CC:  Chief Complaint  Patient presents with  . Advice Only      Judy Flores is an 40 y.o. female.  HPI: Patient presents with complaints regarding her lower extremities and upper arms.  With her legs she is bothered by the excess swelling and soft tissue around her knees and also extending down towards her ankles.  Is been present for her at least a few years and does not seem to be fluctuating much in size.  She is bothered by the contour and the appearance of this externally.  She does not have any pain or restrictions to range of motion.  She has not had an ultrasound of her legs as far she can tell.  Regarding the upper extremity she is bothered by the excess subcutaneous tissue of the upper arms and wants to see if they could look a little slimmer.  No Known Allergies  Outpatient Encounter Medications as of 05/12/2021  Medication Sig Note  . ALPRAZolam (XANAX) 0.5 MG tablet Take 1 tablet (0.5 mg total) by mouth 2 (two) times daily as needed for anxiety.   . baclofen (LIORESAL) 10 MG tablet Take 1 tablet by mouth daily as needed.   . Calcium Carbonate-Vitamin D 600-400 MG-UNIT chew tablet Chew 2 tablets by mouth daily.   . cyclobenzaprine (FLEXERIL) 10 MG tablet One half to one tab PO qHS, then increase gradually to one tab TID.   Marland Kitchen EMGALITY 120 MG/ML SOAJ Inject 1 mL into the skin every 28 (twenty-eight) days.   Marland Kitchen escitalopram (LEXAPRO) 10 MG tablet Take 1 tablet (10 mg total) by mouth daily.   . fluconazole (DIFLUCAN) 150 MG tablet Take 150 mg by mouth once.   Marland Kitchen ibuprofen (ADVIL,MOTRIN) 800 MG tablet Take 1 tablet (800 mg total) by mouth 3 (three) times daily. For 5 - 7 days (can discontinue use if bleeding stops)   . medroxyPROGESTERone (DEPO-PROVERA) 150 MG/ML injection Inject into the muscle. 10/11/2016: Received from: [facility] Received Sig: Inject 150 mg into the muscle every 3  (three) months.  . Multiple Vitamins-Minerals (THERA-M) TABS Take by mouth. 10/11/2016: Received from: [facility] Received Sig: Take 1 tablet by mouth daily.  . naproxen (NAPROSYN) 500 MG tablet Take 500 mg by mouth 2 (two) times daily.   . rizatriptan (MAXALT) 10 MG tablet Take 1 tablet by mouth daily as needed.   . [DISCONTINUED] gabapentin (NEURONTIN) 300 MG capsule Take 1-2 capsules (300-600 mg total) by mouth 3 (three) times daily as needed (cramps).   . [DISCONTINUED] predniSONE (DELTASONE) 50 MG tablet One tab PO daily for 5 days.    No facility-administered encounter medications on file as of 05/12/2021.     Past Medical History:  Diagnosis Date  . Abnormal Pap smear of cervix   . Migraine 01/09/2019  . Tension headache     Past Surgical History:  Procedure Laterality Date  . CRYOTHERAPY    . TUBAL LIGATION      Family History  Problem Relation Age of Onset  . Hypertension Maternal Grandmother   . Diabetes Maternal Grandmother   . Breast cancer Paternal Grandmother     Social History   Social History Narrative  . Not on file  Denies tobacco use  Review of Systems General: Denies fevers, chills, weight loss CV: Denies chest pain, shortness of breath, palpitations  Physical Exam Vitals with BMI 05/12/2021 03/23/2021 03/05/2021  Height 5\' 1"  - 5\' 1"   Weight 204 lbs 3 oz 204 lbs 200 lbs  BMI 38.6 38.57 37.81  Systolic 135 129  Diastolic 87 83 80  Pulse 85 85 82    General:  No acute distress,  Alert and oriented, Non-Toxic, Normal speech and affect Lower extremities: Bilateral lower extremity seem well-perfused with palpable distal pulses.  She does have excess subcutaneous tissue around her knee and extending down towards her ankle.  I do not see any scars.  I do not appreciate any pitting edema and if so it is very minimal.  Bilateral upper extremities show excess adipose tissue in the upper arms with minimal excess skin.  She has reasonable skin tone and  no obvious scars.  Bilateral hands are neurovascularly intact.  Assessment/Plan Patient presents with excess soft tissue in her legs and arms.  We discussed liposuction of both locations.  I would anticipate this to be cosmetic.  I will send her for an ultrasound of her legs before surgery just to make sure that the venous system is functioning appropriately.  If not that might indicate another method of treatment.  I discussed liposuction of her upper arms only with no skin excision.  I explained that I did not feel that the scar from the skin excision would be worth it in her particular case.  I do think she would get a modest improvement in contour with liposuction alone without the need for scarring both locations.  We will plan to provide a quote for her and I will look out for the results of the ultrasound.  I did discuss the risks of liposuction with her that include bleeding, infection, damage to surrounding structures and need for additional procedures.  I discussed the risk of persistent contour irregularities.  All of her questions were answered.  05/12/2021, 1:11 PM

## 2021-05-17 ENCOUNTER — Encounter (HOSPITAL_COMMUNITY): Payer: Self-pay

## 2021-05-17 ENCOUNTER — Ambulatory Visit (HOSPITAL_COMMUNITY): Admission: RE | Admit: 2021-05-17 | Payer: 59 | Source: Ambulatory Visit

## 2021-05-21 ENCOUNTER — Other Ambulatory Visit: Payer: Self-pay

## 2021-05-21 ENCOUNTER — Ambulatory Visit (HOSPITAL_COMMUNITY)
Admission: RE | Admit: 2021-05-21 | Discharge: 2021-05-21 | Disposition: A | Discharge status: Home / Self Care | Payer: 59 | Source: Ambulatory Visit | Attending: Plastic Surgery | Admitting: Plastic Surgery

## 2021-05-21 DIAGNOSIS — M7989 Other specified soft tissue disorders: Secondary | ICD-10-CM | POA: Diagnosis present

## 2021-05-28 ENCOUNTER — Telehealth: Payer: Self-pay

## 2021-05-28 ENCOUNTER — Other Ambulatory Visit: Payer: Self-pay

## 2021-05-28 ENCOUNTER — Ambulatory Visit (INDEPENDENT_AMBULATORY_CARE_PROVIDER_SITE_OTHER): Payer: 59 | Admitting: Osteopathic Medicine

## 2021-05-28 VITALS — BP 128/74 | HR 72

## 2021-05-28 DIAGNOSIS — Z3040 Encounter for surveillance of contraceptives, unspecified: Secondary | ICD-10-CM

## 2021-05-28 MED ORDER — MEDROXYPROGESTERONE ACETATE 150 MG/ML IM SUSP
150.0000 mg | Freq: Once | INTRAMUSCULAR | Status: AC
Start: 2021-05-28 — End: 2021-05-28
  Administered 2021-05-28: 150 mg via INTRAMUSCULAR

## 2021-05-28 NOTE — Telephone Encounter (Signed)
Patient called to request her lab results be explained. I advised her to call Dr. Arita Miss. She said she did, but the office wasn't able to explain it to her in a way she could understand. Discussed with PA, as it was a lab only visit and we have not seen the patient - she would need to schedule an office visit for results. Offered to patient, she declined.

## 2021-05-28 NOTE — Progress Notes (Signed)
Established Patient Office Visit  Subjective:  Patient ID: Judy Flores, female    DOB: 04-30-1981  Age: 40 y.o. MRN: [id number]  CC:  Chief Complaint  Patient presents with  . Contraception    HPI Judy Flores is here for a Depo Provera injection. Denies chest pain, shortness of breath, headaches, mood changes or problems with medication. It has been < 14 weeks since her last Depo Provera injection.   Past Medical History:  Diagnosis Date  . Abnormal Pap smear of cervix   . Migraine 01/09/2019  . Tension headache     Past Surgical History:  Procedure Laterality Date  . CRYOTHERAPY    . TUBAL LIGATION      Family History  Problem Relation Age of Onset  . Hypertension Maternal Grandmother   . Diabetes Maternal Grandmother   . Breast cancer Paternal Grandmother     Social History   Socioeconomic History  . Marital status: Single    Spouse name: Not on file  . Number of children: Not on file  . Years of education: Not on file  . Highest education level: Not on file  Occupational History  . Occupation: phlebotomist  Tobacco Use  . Smoking status: Never Smoker  . Smokeless tobacco: Never Used  Substance and Sexual Activity  . Alcohol use: No    Alcohol/week: 0.0 standard drinks  . Drug use: No  . Sexual activity: Yes    Partners: Male    Birth control/protection: Surgical  Other Topics Concern  . Not on file  Social History Narrative  . Not on file   Social Determinants of Health   Financial Resource Strain: Not on file  Food Insecurity: Not on file  Transportation Needs: Not on file  Physical Activity: Not on file  Stress: Not on file  Social Connections: Not on file  Intimate Partner Violence: Not on file    Outpatient Medications Prior to Visit  Medication Sig Dispense Refill  . ALPRAZolam (XANAX) 0.5 MG tablet Take 1 tablet (0.5 mg total) by mouth 2 (two) times daily as needed for anxiety. 15 tablet 0  . baclofen (LIORESAL) 10 MG tablet Take  1 tablet by mouth daily as needed.    . Calcium Carbonate-Vitamin D 600-400 MG-UNIT chew tablet Chew 2 tablets by mouth daily. 180 tablet 3  . cyclobenzaprine (FLEXERIL) 10 MG tablet One half to one tab PO qHS, then increase gradually to one tab TID. 30 tablet 0  . EMGALITY 120 MG/ML SOAJ Inject 1 mL into the skin every 28 (twenty-eight) days.    Marland Kitchen escitalopram (LEXAPRO) 10 MG tablet Take 1 tablet (10 mg total) by mouth daily. 90 tablet 1  . fluconazole (DIFLUCAN) 150 MG tablet Take 150 mg by mouth once.    Marland Kitchen ibuprofen (ADVIL,MOTRIN) 800 MG tablet Take 1 tablet (800 mg total) by mouth 3 (three) times daily. For 5 - 7 days (can discontinue use if bleeding stops) 30 tablet 0  . medroxyPROGESTERone (DEPO-PROVERA) 150 MG/ML injection Inject into the muscle.    . Multiple Vitamins-Minerals (THERA-M) TABS Take by mouth.    . naproxen (NAPROSYN) 500 MG tablet Take 500 mg by mouth 2 (two) times daily.    . rizatriptan (MAXALT) 10 MG tablet Take 1 tablet by mouth daily as needed.     No facility-administered medications prior to visit.    No Known Allergies  ROS Review of Systems    Objective:    Physical Exam  BP 128/74  Pulse 72   SpO2 100%  Wt Readings from Last 3 Encounters:  05/12/21 204 lb 3.2 oz (92.6 kg)  03/23/21 204 lb (92.5 kg)  03/05/21 200 lb (90.7 kg)     Health Maintenance Due  Topic Date Due  . Pneumococcal Vaccine 80-8 Years old (1 of 4 - PCV13) Never done  . COVID-19 Vaccine (1) Never done    There are no preventive care reminders to display for this patient.  No results found for: TSH Lab Results  Component Value Date   WBC 7.2 08/08/2016   HGB 11.2 (L) 08/08/2016   HCT 35.6 08/08/2016   MCV 85.4 08/08/2016   PLT 367 08/08/2016   Lab Results  Component Value Date   NA 137 06/20/2019   K 4.4 06/20/2019   CO2 24 06/20/2019   GLUCOSE 82 06/20/2019   BUN 8 06/20/2019   CREATININE 0.59 06/20/2019   BILITOT 0.4 01/28/2016   ALKPHOS 54 01/28/2016    AST 14 01/28/2016   ALT 10 01/28/2016   PROT 7.3 01/28/2016   ALBUMIN 4.4 01/28/2016   CALCIUM 9.4 06/20/2019   Lab Results  Component Value Date   CHOL 159 01/28/2016   Lab Results  Component Value Date   HDL 56 01/28/2016   Lab Results  Component Value Date   LDLCALC 95 01/28/2016   Lab Results  Component Value Date   TRIG 41 01/28/2016   Lab Results  Component Value Date   CHOLHDL 2.8 01/28/2016   No results found for: HGBA1C    Assessment & Plan:  Contraceptive - Patient tolerated injection well without complications. Patient advised to schedule next injection between August 19 th - September 2 nd.   Problem List Items Addressed This Visit   None   Visit Diagnoses    Encounter for surveillance of contraceptives, unspecified contraceptive    -  Primary   Relevant Medications   medroxyPROGESTERone (DEPO-PROVERA) injection 150 mg (Completed) (Start on 05/28/2021 10:45 AM)      Meds ordered this encounter  Medications  . medroxyPROGESTERone (DEPO-PROVERA) injection 150 mg    Follow-up: Return in about 12 weeks (around 08/20/2021) for Depo Provera injection. Earna Coder, Janalyn Harder, CMA

## 2021-06-02 ENCOUNTER — Ambulatory Visit (INDEPENDENT_AMBULATORY_CARE_PROVIDER_SITE_OTHER): Payer: 59 | Admitting: Physician Assistant

## 2021-06-02 ENCOUNTER — Other Ambulatory Visit: Payer: Self-pay

## 2021-06-02 VITALS — BP 126/80 | HR 93 | Temp 98.2°F | Resp 20 | Ht 61.0 in | Wt 205.8 lb

## 2021-06-02 DIAGNOSIS — I868 Varicose veins of other specified sites: Secondary | ICD-10-CM | POA: Diagnosis not present

## 2021-06-02 DIAGNOSIS — M7989 Other specified soft tissue disorders: Secondary | ICD-10-CM

## 2021-06-02 NOTE — Progress Notes (Signed)
Requested by:  Sunnie Nielsen, DO [address] [place],  [place] [address]  Reason for consultation: Rule out venous disease   History of Present Illness   Judy Flores is a 40 y.o. ([birth year]) female who presents for evaluation of both lower extremities to rule out venous disease.  The patient is considering liposuction and her plastic surgeon referred her for venous evaluation.  She has no symptoms referable to venous insufficiency.  She states she has noticed increasing fatty distribution of her proximal lower legs and distal thighs that causes her a good deal of distress.  She is considering liposuction of these areas.  She has had no prior history of deep venous thrombosis or venous procedures.  Venous symptoms include: positive if (X) [  ] aching [  ] heavy [  ] tired  [  ] throbbing [  ] burning  [  ] itching [  ]swelling [  ] bleeding [  ] ulcer  Onset/duration: Years Occupation:  phlebotomist Aggravating factors: None  alleviating factors: None Compression: No helps:   Pain medications:  naprosyn Previous vein procedures:  no History of DVT:  no  Past Medical History:  Diagnosis Date  . Abnormal Pap smear of cervix   . Migraine 01/09/2019  . Tension headache     Past Surgical History:  Procedure Laterality Date  . CRYOTHERAPY    . TUBAL LIGATION      Social History   Socioeconomic History  . Marital status: Single    Spouse name: Not on file  . Number of children: Not on file  . Years of education: Not on file  . Highest education level: Not on file  Occupational History  . Occupation: phlebotomist  Tobacco Use  . Smoking status: Never Smoker  . Smokeless tobacco: Never Used  Substance and Sexual Activity  . Alcohol use: No    Alcohol/week: 0.0 standard drinks  . Drug use: No  . Sexual activity: Yes    Partners: Male    Birth control/protection: Surgical  Other Topics Concern  . Not on file  Social History Narrative  . Not  on file   Social Determinants of Health   Financial Resource Strain: Not on file  Food Insecurity: Not on file  Transportation Needs: Not on file  Physical Activity: Not on file  Stress: Not on file  Social Connections: Not on file  Intimate Partner Violence: Not on file    Family History  Problem Relation Age of Onset  . Hypertension Maternal Grandmother   . Diabetes Maternal Grandmother   . Breast cancer Paternal Grandmother     Current Outpatient Medications  Medication Sig Dispense Refill  . ALPRAZolam (XANAX) 0.5 MG tablet Take 1 tablet (0.5 mg total) by mouth 2 (two) times daily as needed for anxiety. 15 tablet 0  . baclofen (LIORESAL) 10 MG tablet Take 1 tablet by mouth daily as needed.    . cyclobenzaprine (FLEXERIL) 10 MG tablet One half to one tab PO qHS, then increase gradually to one tab TID. 30 tablet 0  . EMGALITY 120 MG/ML SOAJ Inject 1 mL into the skin every 28 (twenty-eight) days.    Marland Kitchen escitalopram (LEXAPRO) 10 MG tablet Take 1 tablet (10 mg total) by mouth daily. 90 tablet 1  . fluconazole (DIFLUCAN) 150 MG tablet Take 150 mg by mouth once.    Marland Kitchen ibuprofen (ADVIL,MOTRIN) 800 MG tablet Take 1 tablet (800 mg total) by mouth 3 (three) times  daily. For 5 - 7 days (can discontinue use if bleeding stops) 30 tablet 0  . medroxyPROGESTERone (DEPO-PROVERA) 150 MG/ML injection Inject into the muscle.    . Multiple Vitamins-Minerals (THERA-M) TABS Take by mouth.    . naproxen (NAPROSYN) 500 MG tablet Take 500 mg by mouth 2 (two) times daily.    . Calcium Carbonate-Vitamin D 600-400 MG-UNIT chew tablet Chew 2 tablets by mouth daily. (Patient not taking: Reported on 06/02/2021) 180 tablet 3  . rizatriptan (MAXALT) 10 MG tablet Take 1 tablet by mouth daily as needed.     No current facility-administered medications for this visit.    No Known Allergies  REVIEW OF SYSTEMS (negative unless checked):   Cardiac:  []  Chest pain or chest pressure? []  Shortness of breath upon  activity? []  Shortness of breath when lying flat? []  Irregular heart rhythm?  Vascular:  []  Pain in calf, thigh, or hip brought on by walking? []  Pain in feet at night that wakes you up from your sleep? []  Blood clot in your veins? []  Leg swelling?  Pulmonary:  []  Oxygen at home? []  Productive cough? []  Wheezing?  Neurologic:  []  Sudden weakness in arms or legs? []  Sudden numbness in arms or legs? []  Sudden onset of difficult speaking or slurred speech? []  Temporary loss of vision in one eye? []  Problems with dizziness?  Gastrointestinal:  []  Blood in stool? []  Vomited blood?  Genitourinary:  []  Burning when urinating? []  Blood in urine?  Psychiatric:  []  Major depression  Hematologic:  []  Bleeding problems? []  Problems with blood clotting?  Dermatologic:  []  Rashes or ulcers?  Constitutional:  []  Fever or chills?  Ear/Nose/Throat:  []  Change in hearing? []  Nose bleeds? []  Sore throat?  Musculoskeletal:  []  Back pain? []  Joint pain? []  Muscle pain?   Physical Examination     Vitals:   06/02/21 1332  BP: 126/80  Pulse: 93  Resp: 20  Temp: 98.2 F (36.8 C)  TempSrc: Temporal  SpO2: 100%  Weight: 205 lb 12.8 oz (93.4 kg)  Height: 5\' 1"  (1.549 m)   Body mass index is 38.89 kg/m.  General:  WDWN in NAD; vital signs documented above Gait: Not observed HENT: WNL, normocephalic Pulmonary: normal non-labored breathing Cardiac: regular HR Skin: without rashes Vascular Exam/Pulses: 2+ dorsalis pedis, posterior tibial pulses bilaterally Extremities: without varicose veins, with reticular veins, without edema, without stasis pigmentation, without lipodermatosclerosis, without ulcers Musculoskeletal: no muscle wasting or atrophy  Neurologic: A&O X 3;  No focal weakness or paresthesias are detected Psychiatric:  The pt has Normal affect.  Non-invasive Vascular Imaging   BLE Venous Insufficiency Duplex  06/02/2021 Right:  - No evidence of deep  vein thrombosis seen in the right lower extremity,  from the common femoral through the popliteal veins.  - No evidence of superficial venous thrombosis in the right lower  extremity.  - Venous reflux is noted in the right short saphenous vein.    Left:  - No evidence of deep vein thrombosis seen in the left lower extremity,  from the common femoral through the popliteal veins.  - No evidence of superficial venous thrombosis in the left lower  extremity.  - Venous reflux is noted in the left common femoral vein.  - Venous reflux is noted in the left short saphenous vein.    Medical Decision Making   Judy Flores is a 40 y.o. female who desires liposuction for irregular contour of her lower extremities. 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  No evidence of arterial insufficiency.  No evidence of deep vein thrombosis or superficial venous thrombosis of either lower extremity.  Her venous duplex study today reveals a segment of the right small saphenous vein at the popliteal fossa and mid calf with reflux greater than 500 ms.  There is no reflux at the saphenofemoral junction.  On the left there is reflux of the small saphenous vein at the popliteal fossa.  There is no reflux at the left saphenofemoral junction.   Based on the patient's history and examination, I recommend: Compression stockings during the daytime, avoidance of sitting or standing for long periods of time, elevation with proper positioning, weight control, routine exercise.  She is given written information regarding this today.  No role for endovenous laser ablation.  I discussed with the patient the use of her 15/20 mm knee high compression stockings when at work and when traveling.  Thank you for allowing [name] to participate in this patient's care.   Milinda Antis, PA-C [facility] Office: [phone]  06/02/2021, 1:43 PM  Clinic MD: Dr.  Edilia Bo

## 2021-06-11 ENCOUNTER — Encounter: Payer: 59 | Admitting: Surgical

## 2021-06-30 ENCOUNTER — Encounter: Payer: 59 | Admitting: Plastic Surgery

## 2021-07-08 ENCOUNTER — Encounter: Payer: 59 | Admitting: Plastic Surgery

## 2021-07-27 ENCOUNTER — Encounter: Payer: 59 | Admitting: Surgical

## 2021-08-16 ENCOUNTER — Ambulatory Visit (INDEPENDENT_AMBULATORY_CARE_PROVIDER_SITE_OTHER): Payer: 59 | Admitting: Osteopathic Medicine

## 2021-08-16 ENCOUNTER — Other Ambulatory Visit: Payer: Self-pay

## 2021-08-16 VITALS — BP 121/87 | HR 73 | Temp 99.7°F

## 2021-08-16 DIAGNOSIS — Z3040 Encounter for surveillance of contraceptives, unspecified: Secondary | ICD-10-CM

## 2021-08-16 MED ORDER — MEDROXYPROGESTERONE ACETATE 150 MG/ML IM SUSP
150.0000 mg | Freq: Once | INTRAMUSCULAR | Status: AC
  Administered 2021-08-16: 150 mg via INTRAMUSCULAR

## 2021-08-16 NOTE — Progress Notes (Signed)
HPI:  Patient is here for a Depo Provera injection.  Pt denies chest pain, shortness of breath, headaches, mood changes, or problems with medication.   A&P:  Injection administered in left upper outer quadrant.  Pt tolerated injection well without complications Pt advised to schedule next injection in 12 weeks.       

## 2021-11-08 ENCOUNTER — Other Ambulatory Visit: Payer: Self-pay

## 2021-11-08 ENCOUNTER — Ambulatory Visit (INDEPENDENT_AMBULATORY_CARE_PROVIDER_SITE_OTHER): Payer: 59 | Admitting: Physician Assistant

## 2021-11-08 VITALS — BP 127/82 | HR 86

## 2021-11-08 DIAGNOSIS — Z3040 Encounter for surveillance of contraceptives, unspecified: Secondary | ICD-10-CM | POA: Diagnosis not present

## 2021-11-08 MED ORDER — MEDROXYPROGESTERONE ACETATE 150 MG/ML IM SUSP
150.0000 mg | Freq: Once | INTRAMUSCULAR | Status: AC
  Administered 2021-11-08: 150 mg via INTRAMUSCULAR

## 2021-11-08 NOTE — Progress Notes (Signed)
Depo given LUOQ without complication.  Next injection due Jan 30 - Feb 13.

## 2021-12-31 ENCOUNTER — Encounter: Payer: Self-pay | Admitting: Medical-Surgical

## 2021-12-31 ENCOUNTER — Other Ambulatory Visit: Payer: Self-pay

## 2021-12-31 ENCOUNTER — Ambulatory Visit (INDEPENDENT_AMBULATORY_CARE_PROVIDER_SITE_OTHER): Payer: 59 | Admitting: Medical-Surgical

## 2021-12-31 VITALS — BP 127/83 | HR 100 | Resp 20 | Ht 61.0 in | Wt 207.0 lb

## 2021-12-31 DIAGNOSIS — F320 Major depressive disorder, single episode, mild: Secondary | ICD-10-CM | POA: Diagnosis not present

## 2021-12-31 DIAGNOSIS — G43009 Migraine without aura, not intractable, without status migrainosus: Secondary | ICD-10-CM

## 2021-12-31 DIAGNOSIS — Z1231 Encounter for screening mammogram for malignant neoplasm of breast: Secondary | ICD-10-CM

## 2021-12-31 DIAGNOSIS — F419 Anxiety disorder, unspecified: Secondary | ICD-10-CM

## 2021-12-31 DIAGNOSIS — Z7689 Persons encountering health services in other specified circumstances: Secondary | ICD-10-CM

## 2021-12-31 DIAGNOSIS — Z0000 Encounter for general adult medical examination without abnormal findings: Secondary | ICD-10-CM

## 2022-01-01 NOTE — Progress Notes (Signed)
°  HPI with pertinent ROS:   CC: Transfer of care  HPI: Pleasant 41 year old female presenting today to transfer care to a new PCP and for follow-up on:  Mood-taking Lexapro 10 mg daily as needed.  Does not take this every day but will use it when significantly stressed.  Also using alprazolam 0.5 mg some nights when stressed.  Averaging approximately 5-6 doses per month.  Denies SI/HI.  Migraines-currently getting monthly Ajovy injections which has been very helpful for her migraine management.  Using rizatriptan 10 mg daily as needed for breakthrough migraines with infrequent use.  Also uses ibuprofen as an abortive agent for lesser migraines.  Inquiring about getting a mammogram for the first time.  Would like to have this ordered today if possible.  Is currently using Depo-Provera injections every 3 months for dysmenorrhea.  She has had a bilateral tubal ligation so is not needing this for birth control.  Admits that she does not like to have a period due to.  Symptoms so the Depo-Provera has worked Recruitment consultant since she has not had any menses since starting it.  I reviewed the past medical history, family history, social history, surgical history, and allergies today and no changes were needed.  Please see the problem list section below in epic for further details.   Physical exam:   General: Well Developed, well nourished, and in no acute distress.  Neuro: Alert and oriented x3.  HEENT: Normocephalic, atraumatic.  Skin: Warm and dry. Cardiac: Regular rate and rhythm, no murmurs rubs or gallops, no lower extremity edema.  Respiratory: Clear to auscultation bilaterally. Not using accessory muscles, speaking in full sentences.  Impression and Recommendations:    1. Encounter to establish care Reviewed available information and discussed care concerns with patient.   2. Migraine without aura and without status migrainosus, not intractable Continue Ajovy every month as prescribed.   Continue rizatriptan 10 mg daily as needed.  Managed by [facility].  3. Current mild episode of major depressive disorder, unspecified whether recurrent ([place]) 4. Anxiety Continue Lexapro 10 mg daily as needed.  Advised that this will likely work better if she takes it every day scheduled but if it is working well for her on a as needed basis that is okay.  For now, continue alprazolam 0.5 mg nightly as needed.  Would recommend not using this for sleep since it increases the likelihood of tolerance and dependence.  Discussed very sparing use of this medication.  5. Encounter for screening mammogram for malignant neoplasm of breast Mammogram ordered. - MM 3D SCREEN BREAST BILATERAL; Future  Return for Next Depo-Provera injection in March: Mood follow-up in 6 months. ___________________________________________ Clearnce Sorrel, DNP, APRN, FNP-BC [facility] [place]

## 2022-01-04 ENCOUNTER — Encounter: Payer: Self-pay | Admitting: Medical-Surgical

## 2022-01-05 ENCOUNTER — Other Ambulatory Visit: Payer: Self-pay | Admitting: Medical-Surgical

## 2022-01-05 MED ORDER — ESCITALOPRAM OXALATE 10 MG PO TABS: 10.0000 mg | ORAL_TABLET | Freq: Every day | ORAL | 1 refills | Status: AC

## 2022-01-24 ENCOUNTER — Other Ambulatory Visit: Payer: Self-pay

## 2022-01-24 ENCOUNTER — Ambulatory Visit (INDEPENDENT_AMBULATORY_CARE_PROVIDER_SITE_OTHER): Payer: 59 | Admitting: Medical-Surgical

## 2022-01-24 VITALS — BP 129/83 | HR 97

## 2022-01-24 DIAGNOSIS — Z3040 Encounter for surveillance of contraceptives, unspecified: Secondary | ICD-10-CM | POA: Diagnosis not present

## 2022-01-24 MED ORDER — MEDROXYPROGESTERONE ACETATE 150 MG/ML IM SUSP
150.0000 mg | Freq: Once | INTRAMUSCULAR | Status: AC
  Administered 2022-01-24: 150 mg via INTRAMUSCULAR

## 2022-01-24 NOTE — Progress Notes (Signed)
Established Patient Office Visit  Subjective:  Patient ID: Judy Flores, female    DOB: 1981/04/03  Age: 41 y.o. MRN: [id number]  CC:  Chief Complaint  Patient presents with   Contraception    HPI Judy Flores is here for a Depo Provera injection. Denies chest pain, shortness of breath, headaches, mood changes or problems with medication. It has been < 14 weeks since her last Depo Provera injection.   Past Medical History:  Diagnosis Date   Abnormal Pap smear of cervix    Migraine 01/09/2019   Tension headache     Past Surgical History:  Procedure Laterality Date   CRYOTHERAPY     TUBAL LIGATION      Family History  Problem Relation Age of Onset   Hypertension Maternal Grandmother    Diabetes Maternal Grandmother    Breast cancer Paternal Grandmother     Social History   Socioeconomic History   Marital status: Single    Spouse name: Not on file   Number of children: Not on file   Years of education: Not on file   Highest education level: Not on file  Occupational History   Occupation: phlebotomist  Tobacco Use   Smoking status: Never   Smokeless tobacco: Never  Substance and Sexual Activity   Alcohol use: No    Alcohol/week: 0.0 standard drinks   Drug use: No   Sexual activity: Yes    Partners: Male    Birth control/protection: Surgical  Other Topics Concern   Not on file  Social History Narrative   Not on file   Social Determinants of Health   Financial Resource Strain: Not on file  Food Insecurity: Not on file  Transportation Needs: Not on file  Physical Activity: Not on file  Stress: Not on file  Social Connections: Not on file  Intimate Partner Violence: Not on file    Outpatient Medications Prior to Visit  Medication Sig Dispense Refill   ALPRAZolam (XANAX) 0.5 MG tablet Take 1 tablet (0.5 mg total) by mouth 2 (two) times daily as needed for anxiety. 15 tablet 0   baclofen (LIORESAL) 10 MG tablet Take 1 tablet by mouth daily as needed.      Calcium Carbonate-Vitamin D 600-400 MG-UNIT chew tablet Chew 2 tablets by mouth daily. 180 tablet 3   cyclobenzaprine (FLEXERIL) 10 MG tablet One half to one tab PO qHS, then increase gradually to one tab TID. 30 tablet 0   escitalopram (LEXAPRO) 10 MG tablet Take 1 tablet (10 mg total) by mouth daily. 90 tablet 1   fluconazole (DIFLUCAN) 150 MG tablet Take 150 mg by mouth once.     ibuprofen (ADVIL,MOTRIN) 800 MG tablet Take 1 tablet (800 mg total) by mouth 3 (three) times daily. For 5 - 7 days (can discontinue use if bleeding stops) 30 tablet 0   medroxyPROGESTERone (DEPO-PROVERA) 150 MG/ML injection Inject into the muscle.     Multiple Vitamins-Minerals (THERA-M) TABS Take by mouth.     naproxen (NAPROSYN) 500 MG tablet Take 500 mg by mouth 2 (two) times daily.     rizatriptan (MAXALT) 10 MG tablet Take 1 tablet by mouth daily as needed.     No facility-administered medications prior to visit.    No Known Allergies  ROS Review of Systems    Objective:    Physical Exam  BP 129/83    Pulse 97    SpO2 100%  Wt Readings from Last 3 Encounters:  12/31/21 207 lb (93.9  kg)  06/02/21 205 lb 12.8 oz (93.4 kg)  05/12/21 204 lb 3.2 oz (92.6 kg)     Health Maintenance Due  Topic Date Due   HIV Screening  Never done   Hepatitis C Screening  Never done    There are no preventive care reminders to display for this patient.  No results found for: TSH Lab Results  Component Value Date   WBC 7.2 08/08/2016   HGB 11.2 (L) 08/08/2016   HCT 35.6 08/08/2016   MCV 85.4 08/08/2016   PLT 367 08/08/2016   Lab Results  Component Value Date   NA 137 06/20/2019   K 4.4 06/20/2019   CO2 24 06/20/2019   GLUCOSE 82 06/20/2019   BUN 8 06/20/2019   CREATININE 0.59 06/20/2019   BILITOT 0.4 01/28/2016   ALKPHOS 54 01/28/2016   AST 14 01/28/2016   ALT 10 01/28/2016   PROT 7.3 01/28/2016   ALBUMIN 4.4 01/28/2016   CALCIUM 9.4 06/20/2019   Lab Results  Component Value Date   CHOL  159 01/28/2016   Lab Results  Component Value Date   HDL 56 01/28/2016   Lab Results  Component Value Date   LDLCALC 95 01/28/2016   Lab Results  Component Value Date   TRIG 41 01/28/2016   Lab Results  Component Value Date   CHOLHDL 2.8 01/28/2016   No results found for: HGBA1C    Assessment & Plan:  Contraception - Patient tolerated injection well without complications. Patient advised to schedule next injection between April 17 - May 1.  Problem List Items Addressed This Visit   None Visit Diagnoses     Encounter for surveillance of contraceptives, unspecified contraceptive    -  Primary   Relevant Medications   medroxyPROGESTERone (DEPO-PROVERA) injection 150 mg (Completed) (Start on 01/24/2022 10:45 AM)       Meds ordered this encounter  Medications   medroxyPROGESTERone (DEPO-PROVERA) injection 150 mg    Follow-up: Return in about 12 weeks (around 04/18/2022) for Depo Provera.    Esmond Harps, CMA

## 2022-01-24 NOTE — Progress Notes (Signed)
Agree with documentation as above.  ? ?___________________________________________ ?Judy Flammer L. Keena Heesch, DNP, APRN, FNP-BC ?Primary [facility] and [facility] Medicine ?[facility]  ? ?

## 2022-01-31 ENCOUNTER — Ambulatory Visit (INDEPENDENT_AMBULATORY_CARE_PROVIDER_SITE_OTHER): Payer: Self-pay | Admitting: Medical-Surgical

## 2022-01-31 DIAGNOSIS — Z91199 Patient's noncompliance with other medical treatment and regimen due to unspecified reason: Secondary | ICD-10-CM

## 2022-01-31 NOTE — Progress Notes (Signed)
   Complete physical exam  Patient: Judy Flores   DOB: 10/15/2000   41 y.o. Female  MRN: [id number]  Subjective:    No chief complaint on file.   Judy Flores is a 41 y.o. female who presents today for a complete physical exam. She reports consuming a {diet types:17450} diet. {types:19826} She generally feels {DESC; WELL/FAIRLY WELL/POORLY:18703}. She reports sleeping {DESC; WELL/FAIRLY WELL/POORLY:18703}. She {does/does not:200015} have additional problems to discuss today.    Most recent fall risk assessment:    06/22/2022   10:42 AM  Fall Risk   Falls in the past year? 0  Number falls in past yr: 0  Injury with Fall? 0  Risk for fall due to : No Fall Risks  Follow up Falls evaluation completed     Most recent depression screenings:    06/22/2022   10:42 AM 05/13/2021   10:46 AM  PHQ 2/9 Scores  PHQ - 2 Score 0 0  PHQ- 9 Score 5     {VISON DENTAL STD PSA (Optional):27386}  {History (Optional):23778}  Patient Care Team: Soham Hollett, NP as PCP - General (Nurse Practitioner)   Outpatient Medications Prior to Visit  Medication Sig   fluticasone (FLONASE) 50 MCG/ACT nasal spray Place 2 sprays into both nostrils in the morning and at bedtime. After 7 days, reduce to once daily.   norgestimate-ethinyl estradiol (SPRINTEC 28) 0.25-35 MG-MCG tablet Take 1 tablet by mouth daily.   Nystatin POWD Apply liberally to affected area 2 times per day   spironolactone (ALDACTONE) 100 MG tablet Take 1 tablet (100 mg total) by mouth daily.   No facility-administered medications prior to visit.    ROS        Objective:     There were no vitals taken for this visit. {Vitals History (Optional):23777}  Physical Exam   No results found for any visits on 07/28/22. {Show previous labs (optional):23779}    Assessment & Plan:    Routine Health Maintenance and Physical Exam  Immunization History  Administered Date(s) Administered   DTaP 12/29/1999, 02/24/2000,  05/04/2000, 01/18/2001, 08/03/2004   Hepatitis A 05/30/2008, 06/05/2009   Hepatitis B 10/16/1999, 11/23/1999, 05/04/2000   HiB (PRP-OMP) 12/29/1999, 02/24/2000, 05/04/2000, 01/18/2001   IPV 12/29/1999, 02/24/2000, 10/23/2000, 08/03/2004   Influenza,inj,Quad PF,6+ Mos 09/05/2014   Influenza-Unspecified 12/05/2012   MMR 10/23/2001, 08/03/2004   Meningococcal Polysaccharide 06/04/2012   Pneumococcal Conjugate-13 01/18/2001   Pneumococcal-Unspecified 05/04/2000, 07/18/2000   Tdap 06/04/2012   Varicella 10/23/2000, 05/30/2008    Health Maintenance  Topic Date Due   HIV Screening  Never done   Hepatitis C Screening  Never done   INFLUENZA VACCINE  07/26/2022   PAP-Cervical Cytology Screening  07/28/2022 (Originally 10/14/2020)   PAP SMEAR-Modifier  07/28/2022 (Originally 10/14/2020)   TETANUS/TDAP  07/28/2022 (Originally 06/04/2022)   HPV VACCINES  Discontinued   COVID-19 Vaccine  Discontinued    Discussed health benefits of physical activity, and encouraged her to engage in regular exercise appropriate for her age and condition.  Problem List Items Addressed This Visit   None Visit Diagnoses     Annual physical exam    -  Primary   Cervical cancer screening       Need for Tdap vaccination          No follow-ups on file.     Jaiden Dinkins, NP   

## 2022-04-01 ENCOUNTER — Encounter: Payer: Self-pay | Admitting: Medical-Surgical

## 2022-04-11 ENCOUNTER — Ambulatory Visit: Payer: 59

## 2022-09-30 ENCOUNTER — Encounter: Payer: 59 | Admitting: Medical-Surgical

## 2023-03-08 ENCOUNTER — Encounter: Payer: Self-pay | Admitting: Medical-Surgical
